# Patient Record
Sex: Female | Born: 1960 | Race: Black or African American | Hispanic: No | Marital: Married | Smoking: Never smoker
Health system: Southern US, Community
[De-identification: ages and names within clinical notes are randomized; demographics above are authoritative.]

## PROBLEM LIST (undated history)

## (undated) DIAGNOSIS — E119 Type 2 diabetes mellitus without complications: Secondary | ICD-10-CM

## (undated) DIAGNOSIS — I10 Essential (primary) hypertension: Secondary | ICD-10-CM

## (undated) HISTORY — DX: Type 2 diabetes mellitus without complications: E11.9

---

## 1995-05-02 HISTORY — PX: TUBAL LIGATION: SHX77

## 2005-05-24 ENCOUNTER — Ambulatory Visit (HOSPITAL_COMMUNITY): Admission: RE | Admit: 2005-05-24 | Discharge: 2005-05-24 | Payer: Self-pay | Admitting: *Deleted

## 2006-05-29 ENCOUNTER — Ambulatory Visit (HOSPITAL_COMMUNITY): Admission: RE | Admit: 2006-05-29 | Discharge: 2006-05-29 | Payer: Self-pay | Admitting: Obstetrics & Gynecology

## 2007-05-31 ENCOUNTER — Ambulatory Visit (HOSPITAL_COMMUNITY): Admission: RE | Admit: 2007-05-31 | Discharge: 2007-05-31 | Payer: Self-pay | Admitting: Obstetrics & Gynecology

## 2008-06-12 ENCOUNTER — Ambulatory Visit (HOSPITAL_COMMUNITY): Admission: RE | Admit: 2008-06-12 | Discharge: 2008-06-12 | Payer: Self-pay | Admitting: Obstetrics & Gynecology

## 2009-03-13 ENCOUNTER — Emergency Department (HOSPITAL_COMMUNITY): Admission: EM | Admit: 2009-03-13 | Discharge: 2009-03-13 | Payer: Self-pay | Admitting: Emergency Medicine

## 2009-09-08 ENCOUNTER — Ambulatory Visit (HOSPITAL_COMMUNITY): Admission: RE | Admit: 2009-09-08 | Discharge: 2009-09-08 | Payer: Self-pay

## 2010-08-03 LAB — URINALYSIS, ROUTINE W REFLEX MICROSCOPIC
Bilirubin Urine: NEGATIVE
Glucose, UA: NEGATIVE mg/dL
Protein, ur: 30 mg/dL — AB
Specific Gravity, Urine: 1.027 (ref 1.005–1.030)
pH: 5.5 (ref 5.0–8.0)

## 2010-08-03 LAB — POCT I-STAT, CHEM 8
Calcium, Ion: 1.4 mmol/L — ABNORMAL HIGH (ref 1.12–1.32)
Chloride: 106 mEq/L (ref 96–112)
Glucose, Bld: 123 mg/dL — ABNORMAL HIGH (ref 70–99)
HCT: 42 % (ref 36.0–46.0)
Hemoglobin: 14.3 g/dL (ref 12.0–15.0)
Sodium: 139 mEq/L (ref 135–145)
TCO2: 23 mmol/L (ref 0–100)

## 2010-08-03 LAB — URINE MICROSCOPIC-ADD ON

## 2010-09-09 ENCOUNTER — Other Ambulatory Visit (HOSPITAL_COMMUNITY): Payer: Self-pay | Admitting: Obstetrics & Gynecology

## 2010-09-09 DIAGNOSIS — Z1231 Encounter for screening mammogram for malignant neoplasm of breast: Secondary | ICD-10-CM

## 2010-09-16 ENCOUNTER — Ambulatory Visit (HOSPITAL_COMMUNITY)
Admission: RE | Admit: 2010-09-16 | Discharge: 2010-09-16 | Disposition: A | Discharge status: Home / Self Care | Payer: Self-pay | Source: Ambulatory Visit | Attending: Obstetrics & Gynecology | Admitting: Obstetrics & Gynecology

## 2010-09-16 DIAGNOSIS — Z1231 Encounter for screening mammogram for malignant neoplasm of breast: Secondary | ICD-10-CM

## 2012-04-18 ENCOUNTER — Telehealth (HOSPITAL_COMMUNITY): Payer: Self-pay | Admitting: *Deleted

## 2012-04-18 NOTE — Telephone Encounter (Signed)
Telephoned patient at home # and no answer. 

## 2012-08-16 ENCOUNTER — Telehealth (HOSPITAL_COMMUNITY): Payer: Self-pay | Admitting: *Deleted

## 2012-08-16 NOTE — Telephone Encounter (Signed)
Telephoned patient and left message to return call to [facility] 

## 2015-08-18 ENCOUNTER — Other Ambulatory Visit: Payer: Self-pay

## 2015-08-18 DIAGNOSIS — Z1231 Encounter for screening mammogram for malignant neoplasm of breast: Secondary | ICD-10-CM

## 2015-09-01 ENCOUNTER — Ambulatory Visit
Admission: RE | Admit: 2015-09-01 | Discharge: 2015-09-01 | Disposition: A | Discharge status: Home / Self Care | Payer: BLUE CROSS/BLUE SHIELD | Source: Ambulatory Visit

## 2015-09-01 DIAGNOSIS — Z1231 Encounter for screening mammogram for malignant neoplasm of breast: Secondary | ICD-10-CM

## 2017-08-16 ENCOUNTER — Other Ambulatory Visit: Payer: Self-pay

## 2017-08-16 ENCOUNTER — Encounter (HOSPITAL_COMMUNITY): Payer: Self-pay | Admitting: Emergency Medicine

## 2017-08-16 ENCOUNTER — Emergency Department (HOSPITAL_COMMUNITY)
Admission: EM | Admit: 2017-08-16 | Discharge: 2017-08-17 | Disposition: A | Discharge status: Home / Self Care | Payer: 59 | Attending: Emergency Medicine | Admitting: Emergency Medicine

## 2017-08-16 DIAGNOSIS — I10 Essential (primary) hypertension: Secondary | ICD-10-CM

## 2017-08-16 DIAGNOSIS — R51 Headache: Secondary | ICD-10-CM | POA: Insufficient documentation

## 2017-08-16 DIAGNOSIS — R739 Hyperglycemia, unspecified: Secondary | ICD-10-CM | POA: Diagnosis not present

## 2017-08-16 DIAGNOSIS — H539 Unspecified visual disturbance: Secondary | ICD-10-CM | POA: Diagnosis not present

## 2017-08-16 HISTORY — DX: Essential (primary) hypertension: I10

## 2017-08-16 LAB — I-STAT CHEM 8, ED
BUN: 7 mg/dL (ref 6–20)
CHLORIDE: 107 mmol/L (ref 101–111)
Calcium, Ion: 1.21 mmol/L (ref 1.15–1.40)
Creatinine, Ser: 0.6 mg/dL (ref 0.44–1.00)
GLUCOSE: 447 mg/dL — AB (ref 65–99)
HCT: 42 % (ref 36.0–46.0)
Hemoglobin: 14.3 g/dL (ref 12.0–15.0)
POTASSIUM: 3.7 mmol/L (ref 3.5–5.1)
Sodium: 138 mmol/L (ref 135–145)
TCO2: 21 mmol/L — ABNORMAL LOW (ref 22–32)

## 2017-08-16 NOTE — ED Provider Notes (Signed)
[facility]-EMERGENCY DEPT Provider Note   CSN: [id number] Arrival date & time: 08/16/17  2028     History   Chief Complaint Chief Complaint  Patient presents with  . Hypertension    HPI Cindy Shepherd is a 57 y.o. female.  The history is provided by the patient.  Hypertension  This is a new problem. Episode onset: Unknown. The problem occurs constantly. The problem has not changed since onset.Associated symptoms include headaches. Pertinent negatives include no chest pain, no abdominal pain and no shortness of breath. Nothing aggravates the symptoms. Nothing relieves the symptoms.   Patient with previous history of hypertension that is untreated.  She also reports history of "borderline diabetes " Reports for over a week she has been having mild blurred vision.  Denies blindness or vision loss.  No diplopia noted.  She reports mild headache.  No focal weakness.  No slurred speech. She also reports increased thirst and polyuria She is been using over-the-counter herbal medications without improvement [facility] they told her to go to the ER to have her blood pressure rapidly lowered Past Medical History:  Diagnosis Date  . Hypertension     There are no active problems to display for this patient.   History reviewed. No pertinent surgical history.   OB History   None      Home Medications    Prior to Admission medications   Not on File    Family History No family history on file.  Social History Social History   Tobacco Use  . Smoking status: Never Smoker  . Smokeless tobacco: Never Used  Substance Use Topics  . Alcohol use: Not Currently  . Drug use: Not on file     Allergies   Patient has no known allergies.   Review of Systems Review of Systems  Constitutional: Negative for fever.  Eyes: Positive for visual disturbance.  Respiratory: Negative for shortness of breath.   Cardiovascular:  Negative for chest pain.  Gastrointestinal: Negative for abdominal pain.  Neurological: Positive for headaches. Negative for speech difficulty and weakness.  All other systems reviewed and are negative.    Physical Exam Updated Vital Signs BP (!) 166/121 (BP Location: Left Arm)   Pulse 94   Temp 98.7 F (37.1 C) (Oral)   Resp 16   Ht 1.753 m (5\' 9" )   Wt (!) 137.9 kg (304 lb)   SpO2 98%   BMI 44.89 kg/m   Physical Exam CONSTITUTIONAL: Well developed/well nourished HEAD: Normocephalic/atraumatic EYES: EOMI/PERRL, no nystagmus, no visual field deficit  no ptosis ENMT: Mucous membranes moist NECK: supple no meningeal signs, no bruits CV: S1/S2 noted, no murmurs/rubs/gallops noted LUNGS: Lungs are clear to auscultation bilaterally, no apparent distress ABDOMEN: soft, nontender, no rebound or guarding GU:no cva tenderness NEURO:Awake/alert, face symmetric, no arm or leg drift is noted Equal 5/5 strength with shoulder abduction, elbow flex/extension, wrist flex/extension in upper extremities and equal hand grips bilaterally Equal 5/5 strength with hip flexion,knee flex/extension, foot dorsi/plantar flexion Cranial nerves 3/4/5/6/11/06/08/11/12 tested and intact Gait normal without ataxia No past pointing Sensation to light touch intact in all extremities EXTREMITIES: pulses normal, full ROM SKIN: warm, color normal PSYCH: no abnormalities of mood noted   ED Treatments / Results  Labs (all labs ordered are listed, but only abnormal results are displayed) Labs Reviewed  URINALYSIS, ROUTINE W REFLEX MICROSCOPIC - Abnormal; Notable for the following components:      Result  Value   Color, Urine STRAW (*)    Specific Gravity, Urine 1.036 (*)    Glucose, UA >=500 (*)    Hgb urine dipstick SMALL (*)    Ketones, ur 5 (*)    Bacteria, UA RARE (*)    Squamous Epithelial / LPF 0-5 (*)    All other components within normal limits  I-STAT CHEM 8, ED - Abnormal; Notable for the  following components:   Glucose, Bld 447 (*)    TCO2 21 (*)    All other components within normal limits    EKG None  Radiology No results found.  Procedures Procedures (including critical care time)  Medications Ordered in ED Medications - No data to display   Initial Impression / Assessment and Plan / ED Course  I have reviewed the triage vital signs and the nursing notes.  Pertinent labs  results that were available during my care of the patient were reviewed by me and considered in my medical decision making (see chart for details).     Patient found to have hyperglycemia with glucose over 400.  No signs of DKA.  She is well-appearing.  I suspect this explains her blurred vision and polyuria.  Will start her on metformin.  Will refer to PCP.  Will hold on starting blood pressure medicines for now, as I feel is more important for her to get her glucose under control. We discussed strict return precautions Final Clinical Impressions(s) / ED Diagnoses   Final diagnoses:  Essential hypertension  Hyperglycemia    ED Discharge Orders        Ordered    metFORMIN (GLUCOPHAGE) 500 MG tablet  2 times daily with meals     08/17/17 0037       Zadie RhineWickline, Tommye Lehenbauer, MD 08/17/17 0041

## 2017-08-16 NOTE — ED Triage Notes (Addendum)
Pt arriving from [facility]. Pt sent here due to high blood pressure. Pt reports BP was 176/108 at [facility]. Pt reports it has been a couple weeks since she has taken her prescribed BP medications. Pt states she had been trying herbal supplements instead. Pt having some light headedness and blurred vision.

## 2017-08-17 LAB — URINALYSIS, ROUTINE W REFLEX MICROSCOPIC
Bilirubin Urine: NEGATIVE
Glucose, UA: >=500 mg/dL — AB
KETONES UR: 5 mg/dL — AB
Leukocytes, UA: NEGATIVE
NITRITE: NEGATIVE
PH: 5 (ref 5.0–8.0)
Protein, ur: NEGATIVE mg/dL
SPECIFIC GRAVITY, URINE: 1.036 — AB (ref 1.005–1.030)

## 2017-08-17 MED ORDER — METFORMIN HCL 500 MG PO TABS: 500.0000 mg | ORAL_TABLET | Freq: Two times a day (BID) | ORAL | 0 refills | Status: DC

## 2017-08-27 DIAGNOSIS — E119 Type 2 diabetes mellitus without complications: Secondary | ICD-10-CM | POA: Insufficient documentation

## 2017-09-13 ENCOUNTER — Emergency Department (HOSPITAL_COMMUNITY)
Admission: EM | Admit: 2017-09-13 | Discharge: 2017-09-13 | Disposition: A | Discharge status: Home / Self Care | Payer: 59 | Attending: Emergency Medicine | Admitting: Emergency Medicine

## 2017-09-13 ENCOUNTER — Encounter (HOSPITAL_COMMUNITY): Payer: Self-pay | Admitting: *Deleted

## 2017-09-13 DIAGNOSIS — Z79899 Other long term (current) drug therapy: Secondary | ICD-10-CM | POA: Diagnosis not present

## 2017-09-13 DIAGNOSIS — N200 Calculus of kidney: Secondary | ICD-10-CM | POA: Diagnosis not present

## 2017-09-13 DIAGNOSIS — M5489 Other dorsalgia: Secondary | ICD-10-CM | POA: Insufficient documentation

## 2017-09-13 DIAGNOSIS — I10 Essential (primary) hypertension: Secondary | ICD-10-CM | POA: Insufficient documentation

## 2017-09-13 DIAGNOSIS — R319 Hematuria, unspecified: Secondary | ICD-10-CM | POA: Diagnosis present

## 2017-09-13 LAB — COMPREHENSIVE METABOLIC PANEL
ALK PHOS: 81 U/L (ref 38–126)
ALT: 33 U/L (ref 14–54)
AST: 26 U/L (ref 15–41)
Albumin: 3.9 g/dL (ref 3.5–5.0)
Anion gap: 12 (ref 5–15)
BUN: 11 mg/dL (ref 6–20)
CO2: 19 mmol/L — ABNORMAL LOW (ref 22–32)
CREATININE: 0.76 mg/dL (ref 0.44–1.00)
Calcium: 10.6 mg/dL — ABNORMAL HIGH (ref 8.9–10.3)
Chloride: 108 mmol/L (ref 101–111)
GFR calc Af Amer: >60 mL/min (ref >60)
Glucose, Bld: 197 mg/dL — ABNORMAL HIGH (ref 65–99)
Potassium: 4.1 mmol/L (ref 3.5–5.1)
Sodium: 139 mmol/L (ref 135–145)
Total Bilirubin: 0.9 mg/dL (ref 0.3–1.2)
Total Protein: 8.2 g/dL — ABNORMAL HIGH (ref 6.5–8.1)

## 2017-09-13 LAB — CBC WITH DIFFERENTIAL/PLATELET
BASOS ABS: 0 10*3/uL (ref 0.0–0.1)
Basophils Relative: 0 %
Eosinophils Absolute: 0 10*3/uL (ref 0.0–0.7)
Eosinophils Relative: 0 %
HCT: 38.7 % (ref 36.0–46.0)
Hemoglobin: 12.8 g/dL (ref 12.0–15.0)
LYMPHS ABS: 0.9 10*3/uL (ref 0.7–4.0)
Lymphocytes Relative: 7 %
MCH: 27.6 pg (ref 26.0–34.0)
MCHC: 33.1 g/dL (ref 30.0–36.0)
MCV: 83.6 fL (ref 78.0–100.0)
MONO ABS: 0.3 10*3/uL (ref 0.1–1.0)
Monocytes Relative: 2 %
Neutro Abs: 11.9 10*3/uL — ABNORMAL HIGH (ref 1.7–7.7)
Neutrophils Relative %: 91 %
PLATELETS: 290 10*3/uL (ref 150–400)
RBC: 4.63 MIL/uL (ref 3.87–5.11)
RDW: 14.1 % (ref 11.5–15.5)
WBC: 13.1 10*3/uL — AB (ref 4.0–10.5)

## 2017-09-13 LAB — URINALYSIS, ROUTINE W REFLEX MICROSCOPIC
Bilirubin Urine: NEGATIVE
Ketones, ur: 80 mg/dL — AB
Leukocytes, UA: NEGATIVE
Nitrite: NEGATIVE
Protein, ur: NEGATIVE mg/dL
SPECIFIC GRAVITY, URINE: 1.014 (ref 1.005–1.030)
pH: 6 (ref 5.0–8.0)

## 2017-09-13 LAB — LIPASE, BLOOD: Lipase: 26 U/L (ref 11–51)

## 2017-09-13 MED ORDER — ONDANSETRON HCL 4 MG/2ML IJ SOLN
4.0000 mg | Freq: Once | INTRAMUSCULAR | Status: AC
  Administered 2017-09-13: 4 mg via INTRAVENOUS
  Filled 2017-09-13: qty 2

## 2017-09-13 MED ORDER — KETOROLAC TROMETHAMINE 30 MG/ML IJ SOLN
30.0000 mg | Freq: Once | INTRAMUSCULAR | Status: AC
  Administered 2017-09-13: 30 mg via INTRAVENOUS
  Filled 2017-09-13: qty 1

## 2017-09-13 MED ORDER — ONDANSETRON 4 MG PO TBDP: 4.0000 mg | ORAL_TABLET | Freq: Once | ORAL | Status: DC

## 2017-09-13 MED ORDER — ONDANSETRON 4 MG PO TBDP: 4.0000 mg | ORAL_TABLET | Freq: Three times a day (TID) | ORAL | 0 refills | Status: DC | PRN

## 2017-09-13 MED ORDER — HYDROMORPHONE HCL 1 MG/ML IJ SOLN
1.0000 mg | Freq: Once | INTRAMUSCULAR | Status: AC
  Administered 2017-09-13: 1 mg via INTRAVENOUS
  Filled 2017-09-13: qty 1

## 2017-09-13 MED ORDER — SODIUM CHLORIDE 0.9 % IV BOLUS
1000.0000 mL | Freq: Once | INTRAVENOUS | Status: AC
  Administered 2017-09-13: 1000 mL via INTRAVENOUS

## 2017-09-13 MED ORDER — TAMSULOSIN HCL 0.4 MG PO CAPS: 0.4000 mg | ORAL_CAPSULE | Freq: Every day | ORAL | 0 refills | Status: DC

## 2017-09-13 MED ORDER — OXYCODONE-ACETAMINOPHEN 5-325 MG PO TABS
1.0000 | ORAL_TABLET | Freq: Once | ORAL | Status: AC
  Administered 2017-09-13: 1 via ORAL
  Filled 2017-09-13: qty 1

## 2017-09-13 MED ORDER — OXYCODONE-ACETAMINOPHEN 5-325 MG PO TABS: 1.0000 | ORAL_TABLET | Freq: Four times a day (QID) | ORAL | 0 refills | Status: DC | PRN

## 2017-09-13 NOTE — Discharge Instructions (Signed)
Please read instructions below. °Drink plenty of water. °You can take Percocet every 6 hours as needed for pain. °You can take Zofran every 6 hours as needed for nausea. °Take flomax once per day for bladder spasm. °Follow up with Urology if you have not passed the stone in the next few days. °Return to the ER for fever, chills, uncontrollable vomiting, or worsening symptoms. ° °

## 2017-09-13 NOTE — ED Provider Notes (Signed)
[facility]-EMERGENCY DEPT Provider Note   CSN: [id number] Arrival date & time: 09/13/17  1153     History   Chief Complaint Chief Complaint  Patient presents with  . Hematuria  . Back Pain    HPI Cindy Shepherd is a 57 y.o. female with past medical history of hypertension, nephrolithiasis, presenting to the ED with acute onset of right-sided flank pain that began this morning.  Patient states pain is constant and sharp.  States it feels very similar to history of kidney stone.  Reports associated nausea, vomiting, and hematuria.  Denies fever, chills, significant abdominal pain, vaginal bleeding or discharge, or other complaints.  No medications tried prior to arrival.  The history is provided by the patient.    Past Medical History:  Diagnosis Date  . Hypertension     There are no active problems to display for this patient.   History reviewed. No pertinent surgical history.   OB History   None      Home Medications    Prior to Admission medications   Medication Sig Start Date End Date Taking? Authorizing Provider  amLODipine (NORVASC) 10 MG tablet Take 10 mg by mouth daily. 08/17/17  Yes [provider]  JANUMET 50-500 MG tablet Take 1 tablet by mouth 2 (two) times daily. 08/28/17  Yes [provider]  metFORMIN (GLUCOPHAGE) 500 MG tablet Take 1 tablet (500 mg total) by mouth 2 (two) times daily with a meal. Patient not taking: Reported on 09/13/2017 08/17/17   Zadie Rhine, MD  ondansetron (ZOFRAN ODT) 4 MG disintegrating tablet Take 1 tablet (4 mg total) by mouth every 8 (eight) hours as needed for nausea or vomiting. 09/13/17   Michaelyn Wall, [place] N, PA-C  oxyCODONE-acetaminophen (PERCOCET/ROXICET) 5-325 MG tablet Take 1-2 tablets by mouth every 6 (six) hours as needed for severe pain. 09/13/17   Jalyne Brodzinski, [place] N, PA-C  tamsulosin (FLOMAX) 0.4 MG CAPS capsule Take 1 capsule (0.4 mg total) by mouth daily. 09/13/17   Shalae Belmonte,  [place] N, PA-C    Family History No family history on file.  Social History Social History   Tobacco Use  . Smoking status: Never Smoker  . Smokeless tobacco: Never Used  Substance Use Topics  . Alcohol use: Not Currently  . Drug use: Not on file     Allergies   Penicillins   Review of Systems Review of Systems  Constitutional: Negative for chills and fever.  Gastrointestinal: Positive for nausea and vomiting. Negative for abdominal pain.  Genitourinary: Positive for flank pain, frequency and hematuria. Negative for dysuria, vaginal bleeding and vaginal discharge.  All other systems reviewed and are negative.    Physical Exam Updated Vital Signs BP (!) 138/58   Pulse 93   Temp 98.8 F (37.1 C) (Oral)   Resp 17   Ht  (1.753 m)   Wt 117 kg (258 lb)   SpO2 94%   BMI 38.10 kg/m   Physical Exam  Constitutional: She appears well-developed and well-nourished.  Patient appears significantly uncomfortable on initial evaluation.  HENT:  Head: Normocephalic and atraumatic.  Mouth/Throat: Oropharynx is clear and moist.  Eyes: Conjunctivae are normal.  Cardiovascular: Normal rate, regular rhythm and intact distal pulses.  Pulmonary/Chest: Effort normal and breath sounds normal. No respiratory distress.  Abdominal: Soft. Bowel sounds are normal. She exhibits no mass. There is no tenderness. There is CVA tenderness (Right). There is no rebound and no guarding.  Neurological: She is alert.  Skin:  Skin is warm.  Psychiatric: She has a normal mood and affect. Her behavior is normal.  Nursing note and vitals reviewed.    ED Treatments / Results  Labs (all labs ordered are listed, but only abnormal results are displayed) Labs Reviewed  URINALYSIS, ROUTINE W REFLEX MICROSCOPIC - Abnormal; Notable for the following components:      Result Value   APPearance HAZY (*)    Glucose, UA >=500 (*)    Hgb urine dipstick LARGE (*)    Ketones, ur 80 (*)    RBC / HPF >50  (*)    Bacteria, UA FEW (*)    All other components within normal limits  COMPREHENSIVE METABOLIC PANEL - Abnormal; Notable for the following components:   CO2 19 (*)    Glucose, Bld 197 (*)    Calcium 10.6 (*)    Total Protein 8.2 (*)    All other components within normal limits  CBC WITH DIFFERENTIAL/PLATELET - Abnormal; Notable for the following components:   WBC 13.1 (*)    Neutro Abs 11.9 (*)    All other components within normal limits  LIPASE, BLOOD    EKG None  Radiology No results found.  Procedures Procedures (including critical care time)  Medications Ordered in ED Medications  sodium chloride 0.9 % bolus 1,000 mL (0 mLs Intravenous Stopped 09/13/17 1901)  ondansetron (ZOFRAN) injection 4 mg (4 mg Intravenous Given 09/13/17 1754)  HYDROmorphone (DILAUDID) injection 1 mg (1 mg Intravenous Given 09/13/17 1754)  ketorolac (TORADOL) 30 MG/ML injection 30 mg (30 mg Intravenous Given 09/13/17 1932)  oxyCODONE-acetaminophen (PERCOCET/ROXICET) 5-325 MG per tablet 1 tablet (1 tablet Oral Given 09/13/17 1932)     Initial Impression / Assessment and Plan / ED Course  I have reviewed the triage vital signs and the nursing notes.  Pertinent labs & imaging results that were available during my care of the patient were reviewed by me and considered in my medical decision making (see chart for details).     Pt presenting with  right flank pain with assoc nausea, vomiting, hematuria.  History of nephrolithiasis, patient states it feels the same.  Abdomen is soft and nontender.  UA with large hemoglobin, and no signs of infection; collection does appear to be contaminated.  Serum creatine WNL, vitals sign stable.  Symptoms treated in the ED with pain medication, fluids, and Zofran.  On reevaluation, patient reporting her pain is 0/10 severity and nausea has resolved.  Patient tolerating p.o. liquids.  Pt will be discharged home with pain medications & has been advised to follow up with  PCP/Urologist.  Discussed results, findings, treatment and follow up. Patient advised of return precautions. Patient verbalized understanding and agreed with plan.  Final Clinical Impressions(s) / ED Diagnoses   Final diagnoses:  Nephrolithiasis    ED Discharge Orders        Ordered    oxyCODONE-acetaminophen (PERCOCET/ROXICET) 5-325 MG tablet  Every 6 hours PRN     09/13/17 1939    ondansetron (ZOFRAN ODT) 4 MG disintegrating tablet  Every 8 hours PRN     09/13/17 1939    tamsulosin (FLOMAX) 0.4 MG CAPS capsule  Daily     09/13/17 1939       Zehra Rucci, [place] N, PA-C 09/13/17 2012    Melene Plan, DO 09/13/17 2242

## 2017-09-13 NOTE — ED Triage Notes (Signed)
Pt complains of back pain, hematuria and nausea/emesis since this morning. Pt has hx of kidney stones.

## 2017-09-13 NOTE — ED Notes (Signed)
Pt provided with water for PO challenge.

## 2017-09-15 DIAGNOSIS — N200 Calculus of kidney: Secondary | ICD-10-CM | POA: Insufficient documentation

## 2017-10-04 ENCOUNTER — Ambulatory Visit: Payer: 59

## 2017-10-09 ENCOUNTER — Encounter: Payer: Self-pay | Admitting: Dietician

## 2017-10-09 ENCOUNTER — Encounter: Payer: 59 | Attending: *Deleted | Admitting: Dietician

## 2017-10-09 DIAGNOSIS — E119 Type 2 diabetes mellitus without complications: Secondary | ICD-10-CM | POA: Diagnosis not present

## 2017-10-09 DIAGNOSIS — Z6841 Body Mass Index (BMI) 40.0 and over, adult: Secondary | ICD-10-CM | POA: Diagnosis not present

## 2017-10-09 DIAGNOSIS — Z713 Dietary counseling and surveillance: Secondary | ICD-10-CM | POA: Diagnosis present

## 2017-10-09 NOTE — Progress Notes (Signed)
Patient was seen on 10/09/17 for the first of a series of three diabetes self-management courses at the [facility].  Patient Education Plan per assessed needs and concerns is to attend three course education program for Diabetes Self Management Education.  The following learning objectives were met by the patient during this class:  Describe diabetes  State some common risk factors for diabetes  Defines the role of glucose and insulin  Identifies type of diabetes and pathophysiology  Describe the relationship between diabetes and cardiovascular risk  State the members of the Healthcare Team  States the rationale for glucose monitoring  State when to test glucose  State their individual Target Range  State the importance of logging glucose readings  Describe how to interpret glucose readings  Identifies A1C target  Explain the correlation between A1c and eAG values  State symptoms and treatment of high blood glucose  State symptoms and treatment of low blood glucose  Explain proper technique for glucose testing  Identifies proper sharps disposal  Handouts given during class include:  ADA Diabetes You Take Control   Carb Counting and Meal Planning book  Meal Plan Card  Meal planning worksheet  Low Sodium Flavoring Tips  Types of Fats  The diabetes portion plate  A1c to eAG Conversion Chart  Diabetes Recommended Care Schedule  Support Group  Diabetes Success Plan  Core Class Satisfaction Survey   Follow-Up Plan:  Attend core 2   

## 2017-10-11 ENCOUNTER — Ambulatory Visit: Payer: 59

## 2017-10-16 ENCOUNTER — Encounter: Payer: 59 | Admitting: Dietician

## 2017-10-16 DIAGNOSIS — E119 Type 2 diabetes mellitus without complications: Secondary | ICD-10-CM

## 2017-10-16 DIAGNOSIS — Z713 Dietary counseling and surveillance: Secondary | ICD-10-CM | POA: Diagnosis not present

## 2017-10-16 NOTE — Progress Notes (Signed)
Patient was seen on 10/16/17 for the second of a series of three diabetes self-management courses at the [facility]. The following learning objectives were met by the patient during this class:   Describe the role of different macronutrients on glucose  Explain how carbohydrates affect blood glucose  State what foods contain the most carbohydrates  Demonstrate carbohydrate counting  Demonstrate how to read Nutrition Facts food label  Describe effects of various fats on heart health  Describe the importance of good nutrition for health and healthy eating strategies  Describe techniques for managing your shopping, cooking and meal planning  List strategies to follow meal plan when dining out  Describe the effects of alcohol on glucose and how to use it safely  Goals:  Follow Diabetes Meal Plan as instructed  Aim to spread carbs evenly throughout the day  Aim for 3 meals per day and snacks as needed Include lean protein foods to meals/snacks  Monitor glucose levels as instructed by your doctor   Follow-Up Plan:  Attend Core 3  Work towards following your personal food plan.

## 2017-10-18 ENCOUNTER — Ambulatory Visit: Payer: 59

## 2017-10-23 ENCOUNTER — Encounter: Payer: Self-pay | Admitting: Dietician

## 2017-10-23 ENCOUNTER — Encounter: Payer: 59 | Admitting: Dietician

## 2017-10-23 DIAGNOSIS — E119 Type 2 diabetes mellitus without complications: Secondary | ICD-10-CM

## 2017-10-23 DIAGNOSIS — Z713 Dietary counseling and surveillance: Secondary | ICD-10-CM | POA: Diagnosis not present

## 2017-10-23 NOTE — Progress Notes (Signed)
Patient was seen on 10/23/17 for the third of a series of three diabetes self-management courses at the [facility].   Cindy Shepherd. State the amount of activity recommended for healthy living . Describe activities suitable for individual needs . Identify ways to regularly incorporate activity into daily life . Identify barriers to activity and ways to over come these barriers  Identify diabetes medications being personally used and their primary action for lowering glucose and possible side effects . Describe role of stress on blood glucose and develop strategies to address psychosocial issues . Identify diabetes complications and ways to prevent them  Explain how to manage diabetes during illness . Evaluate success in meeting personal goal . Establish 2-3 goals that they will plan to diligently work on  Goals:   I will count my carb choices at most meals and snacks  I will be active by starting to exercise more.  Your patient has identified these potential barriers to change:  Finances  Your patient has identified their diabetes self-care support plan as  On-line resources  American Diabetes Association Website    Plan:  Attend Support Group as desired

## 2018-01-28 ENCOUNTER — Encounter: Payer: 59 | Attending: *Deleted | Admitting: Dietician

## 2018-01-28 ENCOUNTER — Encounter: Payer: Self-pay | Admitting: Dietician

## 2018-01-28 DIAGNOSIS — E119 Type 2 diabetes mellitus without complications: Secondary | ICD-10-CM | POA: Insufficient documentation

## 2018-01-28 DIAGNOSIS — Z713 Dietary counseling and surveillance: Secondary | ICD-10-CM | POA: Insufficient documentation

## 2018-01-28 NOTE — Progress Notes (Signed)
Diabetes Self-Management Education  Visit Type:  Follow-up  Appt. Start Time: 0845 Appt. End Time: 0915  01/28/2018  [name], identified by name and date of birth, is a 57 y.o. female with a diagnosis of Diabetes: Type 2.   Other history includes HTN, HLD. Medications include Janumet Patient is here today alone.  She has continued to lose weight and uses the Lose it app on her phone to track her intake.  Goal is 1750 calories per day. She states that she is very hungry.  She would like to know what foods to eat to fill her up and still be able to lose weight.  She reports that this started a couple of weeks ago.  She noted some depression and increased stress during this time. Her diet has few fruits and vegetables and many snack foods without significant nutritional value. Discussed tips to improve this within a budget.  Discussed the many ways beans can be used and that these can replace meat.  Weight hx: 08/16/17 304 lbs 10/09/17:  284 lbs 01/28/18:  270 lbs. Patient has lost 34 lbs in the past 5 months.  Patient lives with her husband and son.  She does the shopping and cooking.  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  She travels in 3 counties and works from home at times.  Her stress has increased with her job as well due to a loss of an employee.  She is also helping her son and his family (pregnant wife and 2 children) with his bills as he is underemployed.  This puts additional stress on her.  ASSESSMENT  Weight 270 lb (122.5 kg). Body mass index is 39.02 kg/m.   Diabetes Self-Management Education - 01/28/18 1017      Psychosocial Assessment   Patient Belief/Attitude about Diabetes  Motivated to manage diabetes    Self-care barriers  None    Self-management support  Doctor's office;Family    Patient Concerns  Nutrition/Meal planning    Special Needs  None    Preferred Learning Style  No preference indicated     Learning Readiness  Ready      Pre-Education Assessment   Patient understands the diabetes disease and treatment process.  Demonstrates understanding / competency    Patient understands incorporating nutritional management into lifestyle.  Needs Review    Patient undertands incorporating physical activity into lifestyle.  Needs Review    Patient understands using medications safely.  Demonstrates understanding / competency    Patient understands monitoring blood glucose, interpreting and using results  Demonstrates understanding / competency    Patient understands prevention, detection, and treatment of acute complications.  Demonstrates understanding / competency    Patient understands prevention, detection, and treatment of chronic complications.  Demonstrates understanding / competency    Patient understands how to develop strategies to address psychosocial issues.  Needs Review    Patient understands how to develop strategies to promote health/change behavior.  Needs Review      Complications   How often do you check your blood sugar?  Not recommended by provider      Dietary Intake   Breakfast  chocolate chip cookies, mocha drink    Lunch  2 sausage, 2 scrambled eggs    Snack (afternoon)  skinny popcorn, 2 SF chocolate candy    Dinner  homemade taco dip, tortilla chips    Snack (evening)  homemade PB crackers    Beverage(s)  flavored water,  coffee, regular lemonade, rare diet soda      Exercise   Exercise Type  ADL's      Patient Education   Previous Diabetes Education  Yes (please comment)   core DM classes June 2019   Nutrition management   Other (comment);Meal options for control of blood glucose level and chronic complications.   healthier snack options   Physical activity and exercise   Helped patient identify appropriate exercises in relation to his/her diabetes, diabetes complications and other health issue.    Medications  Reviewed patients medication for diabetes,  action, purpose, timing of dose and side effects.    Psychosocial adjustment  Worked with patient to identify barriers to care and solutions;Role of stress on diabetes    Personal strategies to promote health  Lifestyle issues that need to be addressed for better diabetes care      Individualized Goals (developed by patient)   Nutrition  General guidelines for healthy choices and portions discussed    Physical Activity  Exercise 3-5 times per week;15 minutes per day    Medications  take my medication as prescribed    Monitoring   Not Applicable    Reducing Risk  increase portions of healthy fats    Health Coping  discuss diabetes with (comment)   MD, RD, CDE     Patient Self-Evaluation of Goals - Patient rates self as meeting previously set goals (% of time)   Nutrition  50 - 75 %    Physical Activity  < 25%    Medications  >75%    Monitoring  Not Applicable    Problem Solving  50 - 75 %    Reducing Risk  50 - 75 %    Health Coping  >75%      Post-Education Assessment   Patient understands the diabetes disease and treatment process.  Demonstrates understanding / competency    Patient understands incorporating nutritional management into lifestyle.  Demonstrates understanding / competency    Patient undertands incorporating physical activity into lifestyle.  Demonstrates understanding / competency    Patient understands using medications safely.  Demonstrates understanding / competency    Patient understands monitoring blood glucose, interpreting and using results  Demonstrates understanding / competency    Patient understands prevention, detection, and treatment of acute complications.  Demonstrates understanding / competency    Patient understands prevention, detection, and treatment of chronic complications.  Demonstrates understanding / competency    Patient understands how to develop strategies to address psychosocial issues.  Demonstrates understanding / competency    Patient  understands how to develop strategies to promote health/change behavior.  Demonstrates understanding / competency      Outcomes   Program Status  Completed      Subsequent Visit   Since your last visit have you continued or begun to take your medications as prescribed?  Yes    Since your last visit have you experienced any weight changes?  Loss    Weight Loss (lbs)  14    Since your last visit, are you checking your blood glucose at least once a day?  No       Learning Objective:  Patient will have a greater understanding of diabetes self-management. Patient education plan is to attend individual and/or group sessions per assessed needs and concerns.   Plan:   Patient Instructions  Continue your mindfulness!  Great job! Exercise.  Small amounts add up.  Raw vegetables (fat free Greek yogurt or fat free  sour cream mixed with ranch mix OR hummus) Fruit (nonfat vanilla Greek yogurt, cinnamon OR PB powder mixed with water) Roasting vegetables Onions and peppers with lean   sausage and eggs Water with lemon, lime, or orange slices    Expected Outcomes:  Demonstrated interest in learning. Expect positive outcomes  Education material provided: Meal plan card and Snack sheet, food resource sheets including free meals for her son.  If problems or questions, patient to contact team via:  Phone  Future DSME appointment: - PRN

## 2018-01-28 NOTE — Patient Instructions (Addendum)
Continue your mindfulness!  Great job! Exercise.  Small amounts add up.  Raw vegetables (fat free Greek yogurt or fat free sour cream mixed with ranch mix OR hummus) Fruit (nonfat vanilla Greek yogurt, cinnamon OR PB powder mixed with water) Roasting vegetables Onions and peppers with lean   sausage and eggs Water with lemon, lime, or orange slices

## 2018-05-10 LAB — HM HEPATITIS C SCREENING LAB: HM Hepatitis Screen: NEGATIVE

## 2018-06-12 ENCOUNTER — Other Ambulatory Visit: Payer: Self-pay | Admitting: *Deleted

## 2018-06-12 DIAGNOSIS — Z1231 Encounter for screening mammogram for malignant neoplasm of breast: Secondary | ICD-10-CM

## 2018-07-03 ENCOUNTER — Encounter: Payer: Self-pay | Admitting: Obstetrics and Gynecology

## 2018-07-03 ENCOUNTER — Ambulatory Visit (INDEPENDENT_AMBULATORY_CARE_PROVIDER_SITE_OTHER): Payer: 59 | Admitting: Obstetrics and Gynecology

## 2018-07-03 ENCOUNTER — Other Ambulatory Visit (HOSPITAL_COMMUNITY)
Admission: RE | Admit: 2018-07-03 | Discharge: 2018-07-03 | Disposition: A | Discharge status: Home / Self Care | Payer: 59 | Source: Ambulatory Visit | Attending: Obstetrics and Gynecology | Admitting: Obstetrics and Gynecology

## 2018-07-03 VITALS — BP 125/84 | HR 88 | Ht 69.75 in | Wt 270.3 lb

## 2018-07-03 DIAGNOSIS — N898 Other specified noninflammatory disorders of vagina: Secondary | ICD-10-CM | POA: Diagnosis not present

## 2018-07-03 DIAGNOSIS — Z124 Encounter for screening for malignant neoplasm of cervix: Secondary | ICD-10-CM | POA: Insufficient documentation

## 2018-07-03 DIAGNOSIS — E669 Obesity, unspecified: Secondary | ICD-10-CM

## 2018-07-03 DIAGNOSIS — Z01419 Encounter for gynecological examination (general) (routine) without abnormal findings: Secondary | ICD-10-CM

## 2018-07-03 NOTE — Progress Notes (Signed)
Patient comes in today as a new patient. She is her for an annual exam. She is due for Pap today. Patient is having some vaginal odor. She is wanting to discuss taking a probiotic to help with vaginal health.

## 2018-07-03 NOTE — Progress Notes (Signed)
HPI:      [name] is a 58 y.o. No obstetric history on file. who LMP was No LMP recorded. Patient is postmenopausal.  Subjective:   She presents today for her annual examination.  She complains of occasional vaginal odor.  She reports that she used Monistat last week. She is in menopause with occasional hot flashes. She has a mammogram scheduled for next week. General medications and lab work scheduled through her family physician.  She states her last hemoglobin A1c was 6.    Hx: The following portions of the patient's history were reviewed and updated as appropriate:             She  has a past medical history of Diabetes mellitus without complication (HCC) and Hypertension. She does not have a problem list on file. She  has a past surgical history that includes Tubal ligation (1997). Her family history is not on file. She  reports that she has never smoked. She has never used smokeless tobacco. She reports previous alcohol use. No history on file for drug. She has a current medication list which includes the following prescription(s): amlodipine and janumet. She is allergic to penicillins.       Review of Systems:  Review of Systems  Constitutional: Denied constitutional symptoms, night sweats, recent illness, fatigue, fever, insomnia and weight loss.  Eyes: Denied eye symptoms, eye pain, photophobia, vision change and visual disturbance.  Ears/Nose/Throat/Neck: Denied ear, nose, throat or neck symptoms, hearing loss, nasal discharge, sinus congestion and sore throat.  Cardiovascular: Denied cardiovascular symptoms, arrhythmia, chest pain/pressure, edema, exercise intolerance, orthopnea and palpitations.  Respiratory: Denied pulmonary symptoms, asthma, pleuritic pain, productive sputum, cough, dyspnea and wheezing.  Gastrointestinal: Denied, gastro-esophageal reflux, melena, nausea and vomiting.  Genitourinary: See HPI for additional information.  Musculoskeletal: Denied  musculoskeletal symptoms, stiffness, swelling, muscle weakness and myalgia.  Dermatologic: Denied dermatology symptoms, rash and scar.  Neurologic: Denied neurology symptoms, dizziness, headache, neck pain and syncope.  Psychiatric: Denied psychiatric symptoms, anxiety and depression.  Endocrine: Denied endocrine symptoms including hot flashes and night sweats.   Meds:   Current Outpatient Medications on File Prior to Visit  Medication Sig Dispense Refill  . amLODipine (NORVASC) 10 MG tablet Take 10 mg by mouth daily.  0  . JANUMET 50-500 MG tablet Take 1 tablet by mouth 2 (two) times daily.  2   No current facility-administered medications on file prior to visit.     Objective:     Vitals:   07/03/18 1023  BP: 125/84  Pulse: 88              Physical examination General NAD, Conversant  HEENT Atraumatic; Op clear with mmm.  Normo-cephalic. Pupils reactive. Anicteric sclerae  Thyroid/Neck Smooth without nodularity or enlargement. Normal ROM.  Neck Supple.  Skin No rashes, lesions or ulceration. Normal palpated skin turgor. No nodularity.  Breasts: No masses or discharge.  Symmetric.  No axillary adenopathy.  Lungs: Clear to auscultation.No rales or wheezes. Normal Respiratory effort, no retractions.  Heart: NSR.  No murmurs or rubs appreciated. No periferal edema  Abdomen: Soft.  Non-tender.  No masses.  No HSM. No hernia  Extremities: Moves all appropriately.  Normal ROM for age. No lymphadenopathy.  Neuro: Oriented to PPT.  Normal mood. Normal affect.     Pelvic:   Vulva: Normal appearance.  No lesions.  Vagina: No lesions or abnormalities noted.  Atrophy noted  Support: Normal pelvic support.  Urethra No masses  tenderness or scarring.  Meatus Normal size without lesions or prolapse.  Cervix: Normal appearance.  No lesions.  Anus: Normal exam.  No lesions.  Perineum: Normal exam.  No lesions.        Bimanual   Uterus: Normal size.  Non-tender.  Mobile.  AV.  Adnexae:  No masses.  Non-tender to palpation.  Cul-de-sac: Negative for abnormality.   Physical examination limited by patient body habitus  WET PREP: clue cells: absent, KOH (yeast): negative, odor: absent and trichomoniasis: negative Ph:  < 4.5    Assessment:    No obstetric history on file. There are no active problems to display for this patient.    1. Well woman exam with routine gynecological exam   2. Screening for cervical cancer   3. Vaginal odor   4. Obesity (BMI 35.0-39.9 without comorbidity)     No findings consistent with vaginitis.  We have discussed the use of probiotics.   Plan:            1.  Basic Screening Recommendations The basic screening recommendations for asymptomatic women were discussed with the patient during her visit.  The age-appropriate recommendations were discussed with her and the rational for the tests reviewed.  When I am informed by the patient that another primary care physician has previously obtained the age-appropriate tests and they are up-to-date, only outstanding tests are ordered and referrals given as necessary.  Abnormal results of tests will be discussed with her when all of her results are completed. Pap performed-co-test Mammogram scheduled for next week 2.  Have advised patient if she has vaginal discharge/odor symptoms she should present for repeat wet prep.   Orders No orders of the defined types were placed in this encounter.   No orders of the defined types were placed in this encounter.       F/U  Return in about 1 year (around 07/03/2019) for Annual Physical, Pt to contact us if symptoms worsen.  Elonda Husky, M.D. 07/03/2018 11:09 AM

## 2018-07-09 LAB — CYTOLOGY - PAP
Diagnosis: UNDETERMINED — AB
HPV (WINDOPATH): NOT DETECTED

## 2018-07-10 ENCOUNTER — Ambulatory Visit
Admission: RE | Admit: 2018-07-10 | Discharge: 2018-07-10 | Disposition: A | Discharge status: Home / Self Care | Payer: 59 | Source: Ambulatory Visit | Attending: *Deleted | Admitting: *Deleted

## 2018-07-10 ENCOUNTER — Other Ambulatory Visit: Payer: Self-pay

## 2018-07-10 DIAGNOSIS — Z1231 Encounter for screening mammogram for malignant neoplasm of breast: Secondary | ICD-10-CM

## 2018-07-10 LAB — HM MAMMOGRAPHY

## 2018-08-16 ENCOUNTER — Telehealth: Payer: Self-pay | Admitting: Obstetrics and Gynecology

## 2018-08-16 MED ORDER — FLUCONAZOLE 150 MG PO TABS: 150.0000 mg | ORAL_TABLET | Freq: Once | ORAL | 0 refills | Status: AC

## 2018-08-16 NOTE — Addendum Note (Signed)
Addended by: Silvano Bilis on: 08/16/2018 04:26 PM   Modules accepted: Orders

## 2018-08-16 NOTE — Telephone Encounter (Signed)
Spoke with pt and informed her that she had a yeast infection and medication was sent to her pharmacy for the yeast infection. Pt was informed that a message would be sent to [facility] to give information on her pap test results and she would receive a call on Monday.

## 2018-08-16 NOTE — Telephone Encounter (Signed)
The patient called and stated that she would like to speak with a nurse in regards to her results that were recently released in MyChart. Please advise.

## 2018-08-20 NOTE — Telephone Encounter (Signed)
Pt called and went over pap smear results.

## 2019-07-04 ENCOUNTER — Encounter: Payer: 59 | Admitting: Obstetrics and Gynecology

## 2019-07-08 ENCOUNTER — Encounter: Payer: 59 | Admitting: Obstetrics and Gynecology

## 2019-07-24 ENCOUNTER — Ambulatory Visit: Payer: 59

## 2019-07-28 ENCOUNTER — Ambulatory Visit: Payer: 59

## 2019-08-04 ENCOUNTER — Other Ambulatory Visit: Payer: Self-pay | Admitting: Obstetrics and Gynecology

## 2019-08-04 ENCOUNTER — Other Ambulatory Visit: Payer: Self-pay | Admitting: *Deleted

## 2019-08-04 ENCOUNTER — Ambulatory Visit
Admission: RE | Admit: 2019-08-04 | Discharge: 2019-08-04 | Disposition: A | Discharge status: Home / Self Care | Payer: 59 | Source: Ambulatory Visit

## 2019-08-04 DIAGNOSIS — Z1231 Encounter for screening mammogram for malignant neoplasm of breast: Secondary | ICD-10-CM

## 2019-09-23 ENCOUNTER — Encounter: Payer: Self-pay | Admitting: Obstetrics and Gynecology

## 2019-09-23 ENCOUNTER — Ambulatory Visit (INDEPENDENT_AMBULATORY_CARE_PROVIDER_SITE_OTHER): Payer: 59 | Admitting: Obstetrics and Gynecology

## 2019-09-23 ENCOUNTER — Other Ambulatory Visit: Payer: Self-pay

## 2019-09-23 VITALS — BP 133/84 | HR 93 | Ht 69.5 in | Wt 285.2 lb

## 2019-09-23 DIAGNOSIS — Z01419 Encounter for gynecological examination (general) (routine) without abnormal findings: Secondary | ICD-10-CM | POA: Diagnosis not present

## 2019-09-23 DIAGNOSIS — Z1231 Encounter for screening mammogram for malignant neoplasm of breast: Secondary | ICD-10-CM

## 2019-09-23 DIAGNOSIS — I10 Essential (primary) hypertension: Secondary | ICD-10-CM

## 2019-09-23 DIAGNOSIS — E119 Type 2 diabetes mellitus without complications: Secondary | ICD-10-CM

## 2019-09-23 DIAGNOSIS — Z1211 Encounter for screening for malignant neoplasm of colon: Secondary | ICD-10-CM

## 2019-09-23 DIAGNOSIS — Z7689 Persons encountering health services in other specified circumstances: Secondary | ICD-10-CM

## 2019-09-23 DIAGNOSIS — R61 Generalized hyperhidrosis: Secondary | ICD-10-CM

## 2019-09-23 NOTE — Progress Notes (Signed)
Pt present for annual exam. Pt stated having vaginal pain on the left side of the vaginal area. No other issues.

## 2019-09-23 NOTE — Patient Instructions (Addendum)
Preventive Care 40-60 Years Old, Female Preventive care refers to visits with your health care provider and lifestyle choices that can promote health and wellness. This includes:  A yearly physical exam. This may also be called an annual well check.  Regular dental visits and eye exams.  Immunizations.  Screening for certain conditions.  Healthy lifestyle choices, such as eating a healthy diet, getting regular exercise, not using drugs or products that contain nicotine and tobacco, and limiting alcohol use. What can I expect for my preventive care visit? Physical exam Your health care provider will check your:  Height and weight. This may be used to calculate body mass index (BMI), which tells if you are at a healthy weight.  Heart rate and blood pressure.  Skin for abnormal spots. Counseling Your health care provider may ask you questions about your:  Alcohol, tobacco, and drug use.  Emotional well-being.  Home and relationship well-being.  Sexual activity.  Eating habits.  Work and work environment.  Method of birth control.  Menstrual cycle.  Pregnancy history. What immunizations do I need?  Influenza (flu) vaccine  This is recommended every year. Tetanus, diphtheria, and pertussis (Tdap) vaccine  You may need a Td booster every 10 years. Varicella (chickenpox) vaccine  You may need this if you have not been vaccinated. Zoster (shingles) vaccine  You may need this after age 60. Measles, mumps, and rubella (MMR) vaccine  You may need at least one dose of MMR if you were born in 1957 or later. You may also need a second dose. Pneumococcal conjugate (PCV13) vaccine  You may need this if you have certain conditions and were not previously vaccinated. Pneumococcal polysaccharide (PPSV23) vaccine  You may need one or two doses if you smoke cigarettes or if you have certain conditions. Meningococcal conjugate (MenACWY) vaccine  You may need this if you  have certain conditions. Hepatitis A vaccine  You may need this if you have certain conditions or if you travel or work in places where you may be exposed to hepatitis A. Hepatitis B vaccine  You may need this if you have certain conditions or if you travel or work in places where you may be exposed to hepatitis B. Haemophilus influenzae type b (Hib) vaccine  You may need this if you have certain conditions. Human papillomavirus (HPV) vaccine  If recommended by your health care provider, you may need three doses over 6 months. You may receive vaccines as individual doses or as more than one vaccine together in one shot (combination vaccines). Talk with your health care provider about the risks and benefits of combination vaccines. What tests do I need? Blood tests  Lipid and cholesterol levels. These may be checked every 5 years, or more frequently if you are over 60 years old.  Hepatitis C test.  Hepatitis B test. Screening  Lung cancer screening. You may have this screening every year starting at age 60 if you have a 30-pack-year history of smoking and currently smoke or have quit within the past 15 years.  Colorectal cancer screening. All adults should have this screening starting at age 60 and continuing until age 75. Your health care provider may recommend screening at age 60 if you are at increased risk. You will have tests every 1-10 years, depending on your results and the type of screening test.  Diabetes screening. This is done by checking your blood sugar (glucose) after you have not eaten for a while (fasting). You may have this   done every 1-3 years.  Mammogram. This may be done every 1-2 years. Talk with your health care provider about when you should start having regular mammograms. This may depend on whether you have a family history of breast cancer.  BRCA-related cancer screening. This may be done if you have a family history of breast, ovarian, tubal, or peritoneal  cancers.  Pelvic exam and Pap test. This may be done every 3 years starting at age 60. Starting at age 7, this may be done every 5 years if you have a Pap test in combination with an HPV test. Other tests  Sexually transmitted disease (STD) testing.  Bone density scan. This is done to screen for osteoporosis. You may have this scan if you are at high risk for osteoporosis. Follow these instructions at home: Eating and drinking  Eat a diet that includes fresh fruits and vegetables, whole grains, lean protein, and low-fat dairy.  Take vitamin and mineral supplements as recommended by your health care provider.  Do not drink alcohol if: ? Your health care provider tells you not to drink. ? You are pregnant, may be pregnant, or are planning to become pregnant.  If you drink alcohol: ? Limit how much you have to 0-1 drink a day. ? Be aware of how much alcohol is in your drink. In the [place] one drink equals one 12 oz bottle of beer (355 mL), one 5 oz glass of wine (148 mL), or one 1 oz glass of hard liquor (44 mL). Lifestyle  Take daily care of your teeth and gums.  Stay active. Exercise for at least 30 minutes on 5 or more days each week.  Do not use any products that contain nicotine or tobacco, such as cigarettes, e-cigarettes, and chewing tobacco. If you need help quitting, ask your health care provider.  If you are sexually active, practice safe sex. Use a condom or other form of birth control (contraception) in order to prevent pregnancy and STIs (sexually transmitted infections).  If told by your health care provider, take low-dose aspirin daily starting at age 60. What's next?  Visit your health care provider once a year for a well check visit.  Ask your health care provider how often you should have your eyes and teeth checked.  Stay up to date on all vaccines. This information is not intended to replace advice given to you by your health care provider. Make sure you  discuss any questions you have with your health care provider. Document Revised: 12/27/2017 Document Reviewed: 12/27/2017 Elsevier Patient Education  2020 [place] Breast self-awareness is knowing how your breasts look and feel. Doing breast self-awareness is important. It allows you to catch a breast problem early while it is still small and can be treated. All women should do breast self-awareness, including women who have had breast implants. Tell your doctor if you notice a change in your breasts. What you need:  A mirror.  A well-lit room. How to do a breast self-exam A breast self-exam is one way to learn what is normal for your breasts and to check for changes. To do a breast self-exam: Look for changes  1. Take off all the clothes above your waist. 2. Stand in front of a mirror in a room with good lighting. 3. Put your hands on your hips. 4. Push your hands down. 5. Look at your breasts and nipples in the mirror to see if one breast or nipple looks different from the  other. Check to see if: ? The shape of one breast is different. ? The size of one breast is different. ? There are wrinkles, dips, and bumps in one breast and not the other. 6. Look at each breast for changes in the skin, such as: ? Redness. ? Scaly areas. 7. Look for changes in your nipples, such as: ? Liquid around the nipples. ? Bleeding. ? Dimpling. ? Redness. ? A change in where the nipples are. Feel for changes  1. Lie on your back on the floor. 2. Feel each breast. To do this, follow these steps: ? Pick a breast to feel. ? Put the arm closest to that breast above your head. ? Use your other arm to feel the nipple area of your breast. Feel the area with the pads of your three middle fingers by making small circles with your fingers. For the first circle, press lightly. For the second circle, press harder. For the third circle, press even harder. ? Keep making circles with  your fingers at the different pressures as you move down your breast. Stop when you feel your ribs. ? Move your fingers a little toward the center of your body. ? Start making circles with your fingers again, this time going up until you reach your collarbone. ? Keep making up-and-down circles until you reach your armpit. Remember to keep using the three pressures. ? Feel the other breast in the same way. 3. Sit or stand in the tub or shower. 4. With soapy water on your skin, feel each breast the same way you did in step 2 when you were lying on the floor. Write down what you find Writing down what you find can help you remember what to tell your doctor. Write down:  What is normal for each breast.  Any changes you find in each breast, including: ? The kind of changes you find. ? Whether you have pain. ? Size and location of any lumps.  When you last had your menstrual period. General tips  Check your breasts every month.  If you are breastfeeding, the best time to check your breasts is after you feed your baby or after you use a breast pump.  If you get menstrual periods, the best time to check your breasts is 5-7 days after your menstrual period is over.  With time, you will become comfortable with the self-exam, and you will begin to know if there are changes in your breasts. Contact a doctor if you:  See a change in the shape or size of your breasts or nipples.  See a change in the skin of your breast or nipples, such as red or scaly skin.  Have fluid coming from your nipples that is not normal.  Find a lump or thick area that was not there before.  Have pain in your breasts.  Have any concerns about your breast health. Summary  Breast self-awareness includes looking for changes in your breasts, as well as feeling for changes within your breasts.  Breast self-awareness should be done in front of a mirror in a well-lit room.  You should check your breasts every month.  If you get menstrual periods, the best time to check your breasts is 5-7 days after your menstrual period is over.  Let your doctor know of any changes you see in your breasts, including changes in size, changes on the skin, pain or tenderness, or fluid from your nipples that is not normal. This information is not  intended to replace advice given to you by your health care provider. Make sure you discuss any questions you have with your health care provider. Document Revised: 12/04/2017 Document Reviewed: 12/04/2017 Elsevier Patient Education  [place].   Hyperhidrosis Hyperhidrosis is a condition in which the body sweats a lot more than normal (excessively). Sweating is a necessary function for a human body. It is normal to sweat when you are hot, physically active, or anxious. However, hyperhidrosis is sweating to an excessive degree. Although the condition is not a serious one, it can make you feel embarrassed. There are two kinds of hyperhidrosis:  Primary hyperhidrosis. The sweating usually localizes in one part of your body, such as your underarms, or in a few areas, such as your feet, face, underarms, and hands. This is the more common kind of hyperhidrosis.  Secondary hyperhidrosis. This type usually affects your entire body. What are the causes? The cause of this condition depends on the kind of hyperhidrosis that you have.  Primary hyperhidrosis may be caused by sweat glands that are more active than normal.  Secondary hyperhidrosis may be caused by an underlying condition or by taking certain medicines, such as antidepressants or diabetes medicines. Possible conditions that may cause secondary hyperhidrosis include: ? Diabetes. ? Gout. ? Anxiety. ? Obesity. ? Menopause. ? Overactive thyroid (hyperthyroidism). ? Tumors. ? Frostbite. ? Certain types of cancers. ? Alcoholism. ? Injury to your nervous system. ? Stroke. ? Parkinson's disease. What increases the  risk? You are more likely to develop primary hyperhidrosis if you have a family history of the condition. What are the signs or symptoms? Symptoms of this condition include:  Feeling like you are sweating constantly, even while you are not being active.  Having skin that peels or gets paler or softer in the areas where you sweat the most.  Being able to see sweat on your skin. Other symptoms depend on the kind of hyperhidrosis that you have.  Symptoms of primary hyperhidrosis may include: ? Sweating in the same location on both sides of your body. ? Sweating only during the day and not while you are sleeping. ? Sweating in specific areas, such as your underarms, palms, feet, and face.  Symptoms of secondary hyperhidrosis may include: ? Sweating all over your body. ? Sweating even while you sleep. How is this diagnosed? This condition may be diagnosed by:  Medical history.  Physical exam. You may also have other tests, including:  Tests to measure the amount of sweat you produce and to show the areas where you sweat the most. These tests may involve: ? Using color-changing chemicals to show patterns of sweating on the skin. ? Weighing paper that has been applied to the skin. This will show the amount of sweat that your body produces. ? Measuring the amount of water that evaporates from the skin. ? Using infrared technology to show patterns of sweating on the skin.  Tests to check for other conditions that may be causing excess sweating. This may include blood, urine, or imaging tests. How is this treated? Treatment for this condition depends on the kind of hyperhidrosis that you have and the areas of your body that are affected. Your health care provider will also treat any underlying conditions. Treatment may include:  Medicines, such as: ? Antiperspirants. These are medicines that stop sweat. ? Injectable medicines. These may include small injections of botulinum  toxin. ? Oral medicines. These are taken by mouth to treat underlying conditions and  other symptoms.  A procedure to: ? Temporarily turn off the sweat glands in your hands and feet (iontophoresis). ? Remove your sweat glands. ? Cut or destroy the nerves so that they do not send a signal to the sweat glands (sympathectomy). Follow these instructions at home: Lifestyle   Limit or avoid foods or beverages that may increase your risk of sweating, such as: ? Spicy food. ? Caffeine. ? Alcohol. ? Foods that contain monosodium glutamate (MSG).  If your feet sweat: ? Wear sandals when possible. ? Do not wear cotton socks. Wear socks that remove or wick moisture from your feet. ? Wear leather shoes. ? Avoid wearing the same pair of shoes for two days in a row.  Try placing sweat pads under your clothes to prevent underarm sweat from showing.  Keep a journal of your sweat symptoms and when they occur. This may help you identify things that trigger your sweating. General instructions  Take over-the-counter and prescription medicines only as told by your health care provider.  Use antiperspirants as told by your health care provider.  Consider joining a hyperhidrosis support group.  Keep all follow-up visits as told by your health care provider. This is important. Contact a health care provider if:  You have new symptoms.  Your symptoms get worse. Summary  Hyperhidrosis is a condition in which the body sweats a lot more than normal (excessively).  With primary hyperhidrosis, the sweating usually localizes in one part of your body, such as your underarms, or in a few areas, such as your feet, face, underarms, and hands. It is caused by overactive sweat glands in the affected area.  With secondary hyperhidrosis, the sweating affects your entire body. This is caused by an underlying condition.  Treatment for this condition depends on the kind of hyperhidrosis that you have and the  parts of your body that are affected. This information is not intended to replace advice given to you by your health care provider. Make sure you discuss any questions you have with your health care provider. Document Revised: 02/18/2019 Document Reviewed: 04/20/2017 Elsevier Patient Education  2020 [employer].

## 2019-09-23 NOTE — Progress Notes (Signed)
ANNUAL PREVENTATIVE CARE GYNECOLOGY  ENCOUNTER NOTE  Subjective:       Cindy Shepherd is a 59 y.o. [phone] female here for a routine annual gynecologic exam. The patient is sexually active. The patient has never been on taking hormone replacement therapy. Patient denies post-menopausal vaginal bleeding. The patient wears seatbelts: yes. The patient participates in regular exercise: no.  Current complaints: 1.  Reports persistent excessive sweating.  This has been ongoing for several years.  She reports that she sweats excessively in the axillary, breast, and groin/vaginal region.  She has tried several OTC products (J&J powder, several deodorants including Degree) without much relief (although states that she is now using [employer], which has helped the axillary region).    Gynecologic History No LMP recorded. Patient is postmenopausal. Contraception: post menopausal status Last Pap: 06/2018. Results were: abnormal - ASCUS HPV neg Last mammogram: 08/04/2019. Results were: normal Last Colonoscopy: Has never had one.   Last Dexa Scan: Not due    Obstetric History OB History  Gravida Para Term Preterm AB Living  4 4 4     4   SAB TAB Ectopic Multiple Live Births          4    # Outcome Date GA Lbr Len/2nd Weight Sex Delivery Anes PTL Lv  4 Term 60    M Vag-Spont   LIV  3 Term 24    M Vag-Spont   LIV  2 Term 24    M Vag-Spont   LIV  1 Term 57    M Vag-Spont   LIV    Past Medical History:  Diagnosis Date  . Diabetes mellitus without complication (HCC)   . Hypertension     Family History  Problem Relation Age of Onset  . Diabetes Mother   . Hypertension Mother     Past Surgical History:  Procedure Laterality Date  . TUBAL LIGATION  1997    Social History   Socioeconomic History  . Marital status: Married    Spouse name: Not on file  . Number of children: Not on file  . Years of education: Not on file  . Highest education level: Not on file    Occupational History  . Not on file  Tobacco Use  . Smoking status: Never Smoker  . Smokeless tobacco: Never Used  Substance and Sexual Activity  . Alcohol use: Not Currently  . Drug use: Never  . Sexual activity: Not Currently    Birth control/protection: None  Other Topics Concern  . Not on file  Social History Narrative  . Not on file   Social Determinants of Health   Financial Resource Strain:   . Difficulty of Paying Living Expenses:   Food Insecurity:   . Worried About 100 in the Last Year:   . Programme researcher, broadcasting/film/video in the Last Year:   Transportation Needs:   . Barista (Medical):   Freight forwarder Lack of Transportation (Non-Medical):   Physical Activity:   . Days of Exercise per Week:   . Minutes of Exercise per Session:   Stress:   . Feeling of Stress :   Social Connections:   . Frequency of Communication with Friends and Family:   . Frequency of Social Gatherings with Friends and Family:   . Attends Religious Services:   . Active Member of Clubs or Organizations:   . Attends [employer] Meetings:   Banker Marital Status:   Intimate [place]  Violence:   . Fear of Current or Ex-Partner:   . Emotionally Abused:   [place] Physically Abused:   . Sexually Abused:     Current Outpatient Medications on File Prior to Visit  Medication Sig Dispense Refill  . amLODipine (NORVASC) 10 MG tablet Take 10 mg by mouth daily.  0  . JANUMET 50-500 MG tablet Take 1 tablet by mouth 2 (two) times daily.  2   No current facility-administered medications on file prior to visit.    Allergies  Allergen Reactions  . Penicillins     Has patient had a PCN reaction causing immediate rash, facial/tongue/throat swelling, SOB or lightheadedness with hypotension: Yes Has patient had a PCN reaction causing severe rash involving mucus membranes or skin necrosis: Yes Has patient had a PCN reaction that required hospitalization: No Has patient had a PCN reaction occurring  within the last 10 years: No If all of the above answers are "NO", then may proceed with Cephalosporin use.       Review of Systems ROS Review of Systems - General ROS: negative for - chills, fatigue, fever, hot flashes, night sweats, weight gain or weight loss.  Psychological ROS: negative for - anxiety, decreased libido, depression, mood swings, physical abuse or sexual abuse Ophthalmic ROS: negative for - blurry vision, eye pain or loss of vision ENT ROS: negative for - headaches, hearing change, visual changes or vocal changes Allergy and Immunology ROS: negative for - hives, itchy/watery eyes or seasonal allergies Hematological and Lymphatic ROS: negative for - bleeding problems, bruising, swollen lymph nodes or weight loss Endocrine ROS: negative for - galactorrhea, hair pattern changes, hot flashes, malaise/lethargy, mood swings, palpitations, polydipsia/polyuria, skin changes, temperature intolerance or unexpected weight changes. Positive for excessive sweating.  Breast ROS: negative for - new or changing breast lumps or nipple discharge Respiratory ROS: negative for - cough or shortness of breath Cardiovascular ROS: negative for - chest pain, irregular heartbeat, palpitations or shortness of breath Gastrointestinal ROS: no abdominal pain, change in bowel habits, or black or bloody stools Genito-Urinary ROS: no dysuria, trouble voiding, or hematuria Musculoskeletal ROS: negative for - joint pain or joint stiffness Neurological ROS: negative for - bowel and bladder control changes Dermatological ROS: negative for rash and skin lesion changes   Objective:   BP 133/84   Pulse 93   Ht 5' 9.5" (1.765 m)   Wt 285 lb 3.2 oz (129.4 kg)   BMI 41.51 kg/m  CONSTITUTIONAL: Well-developed, well-nourished female in no acute distress. Morbid obesity PSYCHIATRIC: Normal mood and affect. Normal behavior. Normal judgment and thought content. [place]: Alert and oriented to person, place,  and time. Normal muscle tone coordination. No cranial nerve deficit noted. HENT:  Normocephalic, atraumatic, External right and left ear normal. Oropharynx is clear and moist EYES: Conjunctivae and EOM are normal. Pupils are equal, round, and reactive to light. No scleral icterus.  NECK: Normal range of motion, supple, no masses.  Normal thyroid.  SKIN: Skin is warm and dry. No rash noted. Not diaphoretic. No erythema. No pallor. CARDIOVASCULAR: Normal heart rate noted, regular rhythm, no murmur. RESPIRATORY: Clear to auscultation bilaterally. Effort and breath sounds normal, no problems with respiration noted. BREASTS: Symmetric in size. No masses, skin changes, nipple drainage, or lymphadenopathy. ABDOMEN: Soft, normal bowel sounds, no distention noted.  No tenderness, rebound or guarding.  BLADDER: Normal PELVIC:  Bladder no bladder distension noted  Urethra: normal appearing urethra with no masses, tenderness or lesions  Vulva: normal appearing vulva with  no masses, tenderness or lesions  Vagina: normal appearing vagina with normal color and discharge, no lesions  Cervix: normal appearing cervix without discharge or lesions  Uterus: uterus is normal size, shape, consistency and nontender  Adnexa: normal adnexa in size, nontender and no masses  RV: External Exam NormaI, No Rectal Masses and Normal Sphincter tone  MUSCULOSKELETAL: Normal range of motion. No tenderness.  No cyanosis, clubbing, or edema.  2+ distal pulses. LYMPHATIC: No Axillary, Supraclavicular, or Inguinal Adenopathy.   Labs: Lab Results  Component Value Date   WBC 13.1 (H) 09/13/2017   HGB 12.8 09/13/2017   HCT 38.7 09/13/2017   MCV 83.6 09/13/2017   PLT 290 09/13/2017    Lab Results  Component Value Date   CREATININE 0.76 09/13/2017   BUN 11 09/13/2017   NA 139 09/13/2017   K 4.1 09/13/2017   CL 108 09/13/2017   CO2 19 (L) 09/13/2017    Lab Results  Component Value Date   ALT 33 09/13/2017   AST 26  09/13/2017   ALKPHOS 81 09/13/2017   BILITOT 0.9 09/13/2017    No results found for: CHOL, HDL, LDLCALC, LDLDIRECT, TRIG, CHOLHDL  No results found for: TSH  No results found for: HGBA1C   Assessment:   1. Encounter for well woman exam with routine gynecological exam   2. Breast cancer screening by mammogram   3. Colon cancer screening   4. Encounter to establish care   5. Excessive sweating   6. Essential hypertension   7. Type 2 diabetes mellitus without complication, without long-term current use of insulin (HCC)   '  Plan:  Pap: Not needed. Up to date. ASCUS HR HPV neg pap, can continue q 3 year screens.  Mammogram: Ordered Stool Guaiac Testing:  Not ordered. Will place referral to GI for colonoscopy.  Labs: To be performed by PCP. Patient would like a referral to establish with a PCP. Will place order. Usually has labs done by urgent care.  Routine preventative health maintenance measures emphasized: Exercise/Diet/Weight control, Tobacco Warnings, Alcohol/Substance use risks and Stress Management Discussed management options for excessive sweating, including use of other antiperspirants in sweat areas (use of Tussy), or powders (cornstarch or Gold Bond powder).  HTN and DM currently managed on meds.  Return to Clinic - 1 Year. F/u sooner if symptoms persist or do not resolve.    [facility] and Degree as antiperspirants.   Advised on use of Tussy and cornstarch/gold bond powder.    Hildred Laser, MD  [facility]

## 2019-09-24 ENCOUNTER — Encounter: Payer: Self-pay | Admitting: Gastroenterology

## 2019-10-21 ENCOUNTER — Ambulatory Visit (AMBULATORY_SURGERY_CENTER): Payer: Self-pay

## 2019-10-21 ENCOUNTER — Other Ambulatory Visit: Payer: Self-pay

## 2019-10-21 VITALS — Ht 69.5 in | Wt 282.2 lb

## 2019-10-21 DIAGNOSIS — Z1211 Encounter for screening for malignant neoplasm of colon: Secondary | ICD-10-CM

## 2019-10-21 NOTE — Progress Notes (Signed)
No allergies to soy or egg Pt is not on blood thinners or diet pills Denies issues with sedation/intubation Denies atrial flutter/fib Denies constipation   Emmi instructions given to pt  Pt is aware of Covid safety and care partner requirements.  

## 2019-10-22 ENCOUNTER — Encounter: Payer: Self-pay | Admitting: Gastroenterology

## 2019-11-04 ENCOUNTER — Encounter: Payer: 59 | Admitting: Gastroenterology

## 2020-01-22 ENCOUNTER — Other Ambulatory Visit: Payer: Self-pay

## 2020-01-23 ENCOUNTER — Other Ambulatory Visit: Payer: Self-pay

## 2020-01-28 ENCOUNTER — Other Ambulatory Visit: Payer: Self-pay

## 2020-01-28 DIAGNOSIS — Z7689 Persons encountering health services in other specified circumstances: Secondary | ICD-10-CM

## 2020-03-04 ENCOUNTER — Other Ambulatory Visit: Payer: Self-pay

## 2020-03-05 ENCOUNTER — Encounter: Payer: Self-pay | Admitting: Nurse Practitioner

## 2020-03-05 ENCOUNTER — Ambulatory Visit: Payer: 59 | Admitting: Nurse Practitioner

## 2020-03-05 VITALS — BP 130/84 | HR 100 | Temp 96.0°F | Ht 68.75 in | Wt 282.2 lb

## 2020-03-05 DIAGNOSIS — Z13220 Encounter for screening for lipoid disorders: Secondary | ICD-10-CM | POA: Diagnosis not present

## 2020-03-05 DIAGNOSIS — E1165 Type 2 diabetes mellitus with hyperglycemia: Secondary | ICD-10-CM

## 2020-03-05 DIAGNOSIS — I10 Essential (primary) hypertension: Secondary | ICD-10-CM

## 2020-03-05 DIAGNOSIS — Z0001 Encounter for general adult medical examination with abnormal findings: Secondary | ICD-10-CM | POA: Diagnosis not present

## 2020-03-05 DIAGNOSIS — Z136 Encounter for screening for cardiovascular disorders: Secondary | ICD-10-CM | POA: Diagnosis not present

## 2020-03-05 NOTE — Assessment & Plan Note (Addendum)
BP at goal with amlodipine BP Readings from Last 3 Encounters:  03/05/20 130/84  09/23/19 133/84  07/03/18 125/84   Repeat BMP, TSH today Maintain current medication F/up in 39months

## 2020-03-05 NOTE — Assessment & Plan Note (Addendum)
She is unable to remember last HbA1c States she is up to date with eye exam, will obtain records. Current use of janumet Does not check glucose at home  Stable renal and liver function LDL at goal HgbA1c at 6.1: controlled DM, decrease janumet to 1tab daily. F/up in 66months

## 2020-03-05 NOTE — Progress Notes (Signed)
Subjective:    Patient ID: Cindy MoundJennifer S Debroux, female    DOB: 07/03/1960, [age] y.o.   MRN: [id number]  Patient presents today for CPE and eval of chronic conditions  HPI  [facility], last OV >1year ago.  Benign hypertension BP at goal with amlodipine BP Readings from Last 3 Encounters:  03/05/20 130/84  09/23/19 133/84  07/03/18 125/84   Repeat BMP, TSH today Maintain current medication F/up in 3months  Diabetes mellitus (HCC) She is unable to remember last HbA1c States she is up to date with eye exam, will obtain records. Current use of janumet Does not check glucose at home  Stable renal and liver function LDL at goal HgbA1c at 6.1: controlled DM, decrease janumet to 1tab daily. F/up in 3months   Sexual History (orientation,birth control, marital status, STD):married, sexually active, s/p tubal ligation, up to date with mammogram and pelvic exam.  Depression/Suicide[facility] 03/05/2020 01/28/2018 10/23/2017 10/16/2017 10/09/2017  Decreased Interest 1 0 0 0 0  Down, Depressed, Hopeless 1 1 0 0 0  PHQ - 2 Score 2 1 0 0 0  Altered sleeping 3 - - - -  Tired, decreased energy 3 - - - -  Change in appetite 3 - - - -  Feeling bad or failure about yourself  0 - - - -  Trouble concentrating 0 - - - -  Moving slowly or fidgety/restless 0 - - - -  Suicidal thoughts 0 - - - -  PHQ-9 Score 11 - - - -   Vision:up to date, will get records  Dental:she will schedule  Immunizations: (TDAP, Hep C screen, Pneumovax, Influenza, zoster)  Health Maintenance  Topic Date Due  .  Hepatitis C: One time screening is recommended by Center for Disease Control  (CDC) for  adults born from [birth year] through 1965.   Never done  . Pneumococcal vaccine  Never done  . Complete foot exam   Never done  . Eye exam for diabetics  Never done  . COVID-19 Vaccine (1) Never done  . HIV Screening  Never done  . Tetanus Vaccine  Never done  . Colon Cancer  Screening  Never done  . Flu Shot  11/30/2019  . Hemoglobin A1C  09/05/2020  . Urine Protein Check  03/08/2021  . Pap Smear  07/02/2021  . Mammogram  08/03/2021   Diet:regular.  Weight:  Wt Readings from Last 3 Encounters:  03/05/20 282 lb 3.2 oz (128 kg)  10/21/19 282 lb 3.2 oz (128 kg)  09/23/19 285 lb 3.2 oz (129.4 kg)   Fall Risk: Fall Risk  03/05/2020 01/28/2018 10/23/2017 10/16/2017 10/09/2017  Falls in the past year? 0 No No No No   Medications and allergies reviewed with patient and updated if appropriate.  Patient Active Problem List   Diagnosis Date Noted  . Benign hypertension 03/05/2020  . Morbid obesity (HCC) 03/05/2020  . Kidney stone 09/15/2017  . Diabetes mellitus (HCC) 08/27/2017   No current outpatient medications on file prior to visit.   No current facility-administered medications on file prior to visit.   Past Medical History:  Diagnosis Date  . Diabetes mellitus without complication (HCC)   . Hypertension    Past Surgical History:  Procedure Laterality Date  . TUBAL LIGATION  1997   Social History   Socioeconomic History  . Marital status: Married    Spouse name: Not on file  . Number of children: Not on file  .  Years of education: Not on file  . Highest education level: Not on file  Occupational History  . Not on file  Tobacco Use  . Smoking status: Never Smoker  . Smokeless tobacco: Never Used  Vaping Use  . Vaping Use: Never used  Substance and Sexual Activity  . Alcohol use: Not Currently  . Drug use: Never  . Sexual activity: Not Currently    Birth control/protection: None  Other Topics Concern  . Not on file  Social History Narrative  . Not on file   Social Determinants of Health   Financial Resource Strain:   . Difficulty of Paying Living Expenses: Not on file  Food Insecurity:   . Worried About Programme researcher, broadcasting/film/video in the Last Year: Not on file  . Ran Out of Food in the Last Year: Not on file  Transportation Needs:   .  Lack of Transportation (Medical): Not on file  . Lack of Transportation (Non-Medical): Not on file  Physical Activity:   . Days of Exercise per Week: Not on file  . Minutes of Exercise per Session: Not on file  Stress:   . Feeling of Stress : Not on file  Social Connections:   . Frequency of Communication with Friends and Family: Not on file  . Frequency of Social Gatherings with Friends and Family: Not on file  . Attends Religious Services: Not on file  . Active Member of Clubs or Organizations: Not on file  . Attends Banker Meetings: Not on file  . Marital Status: Not on file    Family History  Problem Relation Age of Onset  . Diabetes Mother   . Hypertension Mother   . Colon cancer Neg Hx   . Colon polyps Neg Hx   . Esophageal cancer Neg Hx   . Rectal cancer Neg Hx   . Stomach cancer Neg Hx         Review of Systems  Constitutional: Negative for fever, malaise/fatigue and weight loss.  HENT: Negative for congestion and sore throat.   Eyes:       Negative for visual changes  Respiratory: Negative for cough and shortness of breath.   Cardiovascular: Negative for chest pain, palpitations and leg swelling.  Gastrointestinal: Negative for blood in stool, constipation, diarrhea and heartburn.  Genitourinary: Negative for dysuria, frequency and urgency.  Musculoskeletal: Negative for falls, joint pain and myalgias.  Skin: Negative for rash.  Neurological: Negative for dizziness, sensory change and headaches.  Endo/Heme/Allergies: Does not bruise/bleed easily.  Psychiatric/Behavioral: Negative for depression, substance abuse and suicidal ideas. The patient is not nervous/anxious.    Objective:   Vitals:   03/05/20 0849  BP: 130/84  Pulse: 100  Temp: (!) 96 F (35.6 C)  SpO2: 98%    Body mass index is 41.98 kg/m.   Physical Examination:  Physical Exam Constitutional:      General: She is not in acute distress.    Appearance: She is obese.    HENT:     Right Ear: Tympanic membrane, ear canal and external ear normal.     Left Ear: Tympanic membrane, ear canal and external ear normal.  Eyes:     General: No scleral icterus.    Extraocular Movements: Extraocular movements intact.     Conjunctiva/sclera: Conjunctivae normal.  Neck:     Thyroid: No thyromegaly.  Cardiovascular:     Rate and Rhythm: Normal rate and regular rhythm.     Pulses: Normal pulses.  Heart sounds: Normal heart sounds.  Pulmonary:     Effort: Pulmonary effort is normal.     Breath sounds: Normal breath sounds.  Chest:     Chest wall: No tenderness.  Abdominal:     General: Bowel sounds are normal. There is no distension.     Palpations: Abdomen is soft.     Tenderness: There is no abdominal tenderness.  Genitourinary:    Comments: Deferred breast and pelvic exam to GYN per patient Musculoskeletal:        General: No tenderness. Normal range of motion.     Cervical back: Normal range of motion and neck supple.  Lymphadenopathy:     Cervical: No cervical adenopathy.  Skin:    General: Skin is warm and dry.  Neurological:     Mental Status: She is alert and oriented to person, place, and time.  Psychiatric:        Behavior: Behavior normal.        Judgment: Judgment normal.    ASSESSMENT and PLAN: This visit occurred during the SARS-CoV-2 public health emergency.  Safety protocols were in place, including screening questions prior to the visit, additional usage of staff PPE, and extensive cleaning of exam room while observing appropriate contact time as indicated for disinfecting solutions.   [place] was seen today for establish care.  Diagnoses and all orders for this visit:  Encounter for preventative adult health care exam with abnormal findings -     Cancel: CBC with Differential/Platelet -     Cancel: Comprehensive metabolic panel -     Cancel: Lipid panel -     CBC with Differential/Platelet; Future  Benign hypertension -      Comprehensive metabolic panel; Future -     TSH; Future -     amLODipine (NORVASC) 10 MG tablet; Take 1 tablet (10 mg total) by mouth daily.  Type 2 diabetes mellitus with hyperglycemia, without long-term current use of insulin (HCC) -     Cancel: Hemoglobin A1c -     Cancel: Microalbumin / creatinine urine ratio -     Hemoglobin A1c; Future -     Lipid panel; Future -     Microalbumin / creatinine urine ratio; Future -     JANUMET 50-500 MG tablet; Take 1 tablet by mouth daily after breakfast.  Encounter for lipid screening for cardiovascular disease -     Cancel: Lipid panel  Morbid obesity (HCC) -     Cancel: TSH -     Cancel: Vitamin D 1,25 dihydroxy -     Lipid panel; Future -     TSH; Future -     Cancel: Vitamin D 1,25 dihydroxy; Future      Problem List Items Addressed This Visit      Cardiovascular and Mediastinum   Benign hypertension    BP at goal with amlodipine BP Readings from Last 3 Encounters:  03/05/20 130/84  09/23/19 133/84  07/03/18 125/84   Repeat BMP, TSH today Maintain current medication F/up in 55months      Relevant Medications   amLODipine (NORVASC) 10 MG tablet   Other Relevant Orders   Comprehensive metabolic panel (Completed)   TSH (Completed)     Endocrine   Diabetes mellitus (HCC)    She is unable to remember last HbA1c States she is up to date with eye exam, will obtain records. Current use of janumet Does not check glucose at home  Stable renal and liver function LDL at  goal HgbA1c at 6.1: controlled DM, decrease janumet to 1tab daily. F/up in 39months       Relevant Medications   JANUMET 50-500 MG tablet   Other Relevant Orders   Hemoglobin A1c (Completed)   Lipid panel (Completed)   Microalbumin / creatinine urine ratio (Completed)     Other   Morbid obesity (HCC)   Relevant Medications   JANUMET 50-500 MG tablet   Other Relevant Orders   Lipid panel (Completed)   TSH (Completed)    Other Visit Diagnoses     Encounter for preventative adult health care exam with abnormal findings    -  Primary   Relevant Orders   CBC with Differential/Platelet (Completed)   Encounter for lipid screening for cardiovascular disease          Follow up: Return in about 3 months (around 06/05/2020) for DM and HTN (fasting, ).  Alysia Penna, NP

## 2020-03-05 NOTE — Patient Instructions (Signed)
Thank you for choosing [facility] for your health needs  Sign medical release form to get records from previous pcp and opthalmology  Schedule lab appt. You need to be fasting at least 6-8hrs prior to blood draw.  Schedule appt with GI for colonoscopy.  Health Maintenance, Female Adopting a healthy lifestyle and getting preventive care are important in promoting health and wellness. Ask your health care provider about:  The right schedule for you to have regular tests and exams.  Things you can do on your own to prevent diseases and keep yourself healthy. What should I know about diet, weight, and exercise? Eat a healthy diet   Eat a diet that includes plenty of vegetables, fruits, low-fat dairy products, and lean protein.  Do not eat a lot of foods that are high in solid fats, added sugars, or sodium. Maintain a healthy weight Body mass index (BMI) is used to identify weight problems. It estimates body fat based on height and weight. Your health care provider can help determine your BMI and help you achieve or maintain a healthy weight. Get regular exercise Get regular exercise. This is one of the most important things you can do for your health. Most adults should:  Exercise for at least 150 minutes each week. The exercise should increase your heart rate and make you sweat (moderate-intensity exercise).  Do strengthening exercises at least twice a week. This is in addition to the moderate-intensity exercise.  Spend less time sitting. Even light physical activity can be beneficial. Watch cholesterol and blood lipids Have your blood tested for lipids and cholesterol at 60 years of age, then have this test every 5 years. Have your cholesterol levels checked more often if:  Your lipid or cholesterol levels are high.  You are older than 60 years of age.  You are at high risk for heart disease. What should I know about cancer screening? Depending on your health history  and family history, you may need to have cancer screening at various ages. This may include screening for:  Breast cancer.  Cervical cancer.  Colorectal cancer.  Skin cancer.  Lung cancer. What should I know about heart disease, diabetes, and high blood pressure? Blood pressure and heart disease  High blood pressure causes heart disease and increases the risk of stroke. This is more likely to develop in people who have high blood pressure readings, are of African descent, or are overweight.  Have your blood pressure checked: ? Every 3-5 years if you are 75-46 years of age. ? Every year if you are 21 years old or older. Diabetes Have regular diabetes screenings. This checks your fasting blood sugar level. Have the screening done:  Once every three years after age 36 if you are at a normal weight and have a low risk for diabetes.  More often and at a younger age if you are overweight or have a high risk for diabetes. What should I know about preventing infection? Hepatitis B If you have a higher risk for hepatitis B, you should be screened for this virus. Talk with your health care provider to find out if you are at risk for hepatitis B infection. Hepatitis C Testing is recommended for:  Everyone born from [birth year] through 1966.  Anyone with known risk factors for hepatitis C. Sexually transmitted infections (STIs)  Get screened for STIs, including gonorrhea and chlamydia, if: ? You are sexually active and are younger than 60 years of age. ? You are older than  60 years of age and your health care provider tells you that you are at risk for this type of infection. ? Your sexual activity has changed since you were last screened, and you are at increased risk for chlamydia or gonorrhea. Ask your health care provider if you are at risk.  Ask your health care provider about whether you are at high risk for HIV. Your health care provider may recommend a prescription medicine to help  prevent HIV infection. If you choose to take medicine to prevent HIV, you should first get tested for HIV. You should then be tested every 3 months for as long as you are taking the medicine. Pregnancy  If you are about to stop having your period (premenopausal) and you may become pregnant, seek counseling before you get pregnant.  Take 400 to 800 micrograms (mcg) of folic acid every day if you become pregnant.  Ask for birth control (contraception) if you want to prevent pregnancy. Osteoporosis and menopause Osteoporosis is a disease in which the bones lose minerals and strength with aging. This can result in bone fractures. If you are 11 years old or older, or if you are at risk for osteoporosis and fractures, ask your health care provider if you should:  Be screened for bone loss.  Take a calcium or vitamin D supplement to lower your risk of fractures.  Be given hormone replacement therapy (HRT) to treat symptoms of menopause. Follow these instructions at home: Lifestyle  Do not use any products that contain nicotine or tobacco, such as cigarettes, e-cigarettes, and chewing tobacco. If you need help quitting, ask your health care provider.  Do not use street drugs.  Do not share needles.  Ask your health care provider for help if you need support or information about quitting drugs. Alcohol use  Do not drink alcohol if: ? Your health care provider tells you not to drink. ? You are pregnant, may be pregnant, or are planning to become pregnant.  If you drink alcohol: ? Limit how much you use to 0-1 drink a day. ? Limit intake if you are breastfeeding.  Be aware of how much alcohol is in your drink. In the [place] one drink equals one 12 oz bottle of beer (355 mL), one 5 oz glass of wine (148 mL), or one 1 oz glass of hard liquor (44 mL). General instructions  Schedule regular health, dental, and eye exams.  Stay current with your vaccines.  Tell your health care provider  if: ? You often feel depressed. ? You have ever been abused or do not feel safe at home. Summary  Adopting a healthy lifestyle and getting preventive care are important in promoting health and wellness.  Follow your health care provider's instructions about healthy diet, exercising, and getting tested or screened for diseases.  Follow your health care provider's instructions on monitoring your cholesterol and blood pressure. This information is not intended to replace advice given to you by your health care provider. Make sure you discuss any questions you have with your health care provider. Document Revised: 04/10/2018 Document Reviewed: 04/10/2018 Elsevier Patient Education  2020 [employer].

## 2020-03-08 ENCOUNTER — Other Ambulatory Visit (INDEPENDENT_AMBULATORY_CARE_PROVIDER_SITE_OTHER): Payer: 59

## 2020-03-08 ENCOUNTER — Other Ambulatory Visit: Payer: Self-pay

## 2020-03-08 DIAGNOSIS — I10 Essential (primary) hypertension: Secondary | ICD-10-CM

## 2020-03-08 DIAGNOSIS — E1165 Type 2 diabetes mellitus with hyperglycemia: Secondary | ICD-10-CM | POA: Diagnosis not present

## 2020-03-08 DIAGNOSIS — Z0001 Encounter for general adult medical examination with abnormal findings: Secondary | ICD-10-CM | POA: Diagnosis not present

## 2020-03-08 LAB — CBC WITH DIFFERENTIAL/PLATELET
Basophils Absolute: 0.1 10*3/uL (ref 0.0–0.1)
Basophils Relative: 0.8 % (ref 0.0–3.0)
Eosinophils Absolute: 0.2 10*3/uL (ref 0.0–0.7)
Eosinophils Relative: 2.1 % (ref 0.0–5.0)
HCT: 39.6 % (ref 36.0–46.0)
Hemoglobin: 12.5 g/dL (ref 12.0–15.0)
Lymphocytes Relative: 26.3 % (ref 12.0–46.0)
Lymphs Abs: 2.4 10*3/uL (ref 0.7–4.0)
MCHC: 31.5 g/dL (ref 30.0–36.0)
MCV: 82.5 fl (ref 78.0–100.0)
Monocytes Absolute: 0.5 10*3/uL (ref 0.1–1.0)
Monocytes Relative: 5.9 % (ref 3.0–12.0)
Neutro Abs: 5.9 10*3/uL (ref 1.4–7.7)
Neutrophils Relative %: 64.9 % (ref 43.0–77.0)
Platelets: 260 10*3/uL (ref 150.0–400.0)
RBC: 4.79 Mil/uL (ref 3.87–5.11)
RDW: 14.6 % (ref 11.5–15.5)
WBC: 9.1 10*3/uL (ref 4.0–10.5)

## 2020-03-08 LAB — COMPREHENSIVE METABOLIC PANEL
ALT: 9 U/L (ref 0–35)
AST: 10 U/L (ref 0–37)
Albumin: 4 g/dL (ref 3.5–5.2)
Alkaline Phosphatase: 81 U/L (ref 39–117)
BUN: 10 mg/dL (ref 6–23)
CO2: 24 mEq/L (ref 19–32)
Calcium: 10.4 mg/dL (ref 8.4–10.5)
Chloride: 107 mEq/L (ref 96–112)
Creatinine, Ser: 0.73 mg/dL (ref 0.40–1.20)
GFR: 89.64 mL/min (ref >60.00)
Glucose, Bld: 84 mg/dL (ref 70–99)
Potassium: 4.4 mEq/L (ref 3.5–5.1)
Sodium: 138 mEq/L (ref 135–145)
Total Bilirubin: 0.4 mg/dL (ref 0.2–1.2)
Total Protein: 7.4 g/dL (ref 6.0–8.3)

## 2020-03-08 LAB — LIPID PANEL
Cholesterol: 120 mg/dL (ref 0–200)
HDL: 36.1 mg/dL — ABNORMAL LOW (ref >39.00)
LDL Cholesterol: 73 mg/dL (ref 0–99)
NonHDL: 83.85
Total CHOL/HDL Ratio: 3
Triglycerides: 54 mg/dL (ref 0.0–149.0)
VLDL: 10.8 mg/dL (ref 0.0–40.0)

## 2020-03-08 LAB — MICROALBUMIN / CREATININE URINE RATIO
Creatinine,U: 216.6 mg/dL
Microalb Creat Ratio: 0.9 mg/g (ref 0.0–30.0)
Microalb, Ur: 2 mg/dL — ABNORMAL HIGH (ref 0.0–1.9)

## 2020-03-08 LAB — HEMOGLOBIN A1C: Hgb A1c MFr Bld: 6.1 % (ref 4.6–6.5)

## 2020-03-08 LAB — TSH: TSH: 1.52 u[IU]/mL (ref 0.35–4.50)

## 2020-03-09 MED ORDER — AMLODIPINE BESYLATE 10 MG PO TABS: 10.0000 mg | ORAL_TABLET | Freq: Every day | ORAL | 3 refills | Status: DC

## 2020-03-09 MED ORDER — JANUMET 50-500 MG PO TABS: 1.0000 | ORAL_TABLET | Freq: Every day | ORAL | 3 refills | Status: DC

## 2020-03-11 LAB — VITAMIN D 1,25 DIHYDROXY
Vitamin D 1, 25 (OH)2 Total: 75 pg/mL — ABNORMAL HIGH (ref 18–72)
Vitamin D2 1, 25 (OH)2: <8 pg/mL
Vitamin D3 1, 25 (OH)2: 75 pg/mL

## 2020-06-07 ENCOUNTER — Other Ambulatory Visit: Payer: Self-pay

## 2020-06-08 ENCOUNTER — Ambulatory Visit: Payer: 59 | Admitting: Nurse Practitioner

## 2020-06-09 ENCOUNTER — Telehealth: Payer: Self-pay | Admitting: Nurse Practitioner

## 2020-06-09 NOTE — Telephone Encounter (Signed)
Pt dropped off form to be filled out by provider, I put in Charlotte's folder up front

## 2020-06-14 DIAGNOSIS — Z0279 Encounter for issue of other medical certificate: Secondary | ICD-10-CM

## 2020-07-09 ENCOUNTER — Other Ambulatory Visit: Payer: Self-pay

## 2020-07-09 ENCOUNTER — Encounter: Payer: Self-pay | Admitting: Nurse Practitioner

## 2020-07-09 ENCOUNTER — Ambulatory Visit: Payer: 59 | Admitting: Nurse Practitioner

## 2020-07-09 VITALS — BP 136/82 | HR 85 | Temp 96.9°F | Ht 68.75 in | Wt 279.2 lb

## 2020-07-09 DIAGNOSIS — I10 Essential (primary) hypertension: Secondary | ICD-10-CM | POA: Diagnosis not present

## 2020-07-09 DIAGNOSIS — E1165 Type 2 diabetes mellitus with hyperglycemia: Secondary | ICD-10-CM

## 2020-07-09 DIAGNOSIS — M5412 Radiculopathy, cervical region: Secondary | ICD-10-CM | POA: Diagnosis not present

## 2020-07-09 LAB — HEMOGLOBIN A1C: Hgb A1c MFr Bld: 5.8 % (ref 4.6–6.5)

## 2020-07-09 LAB — BASIC METABOLIC PANEL
BUN: 12 mg/dL (ref 6–23)
CO2: 24 mEq/L (ref 19–32)
Calcium: 10.4 mg/dL (ref 8.4–10.5)
Chloride: 107 mEq/L (ref 96–112)
Creatinine, Ser: 0.79 mg/dL (ref 0.40–1.20)
GFR: 81.34 mL/min (ref >60.00)
Glucose, Bld: 92 mg/dL (ref 70–99)
Potassium: 3.9 mEq/L (ref 3.5–5.1)
Sodium: 139 mEq/L (ref 135–145)

## 2020-07-09 MED ORDER — MELOXICAM 7.5 MG PO TABS: 7.5000 mg | ORAL_TABLET | Freq: Every day | ORAL | 0 refills | Status: DC

## 2020-07-09 MED ORDER — TIZANIDINE HCL 2 MG PO TABS: 2.0000 mg | ORAL_TABLET | Freq: Every day | ORAL | 0 refills | Status: DC

## 2020-07-09 NOTE — Progress Notes (Signed)
Subjective:  Patient ID: Cindy Shepherd, female    DOB: March 02, 1961  Age: 60 y.o. MRN: [id number]  CC: Follow-up (3 month f/u on DM and HTN. Pt is fasting today. )  Neck Pain  This is a new problem. The problem occurs intermittently. The problem has been waxing and waning. The pain is associated with an unknown factor. The pain is present in the occipital region. The quality of the pain is described as cramping. The symptoms are aggravated by twisting and position. The pain is worse during the night. Associated symptoms include tingling. Pertinent negatives include no chest pain, fever, headaches, leg pain, numbness, pain with swallowing, paresis, photophobia, syncope, trouble swallowing, visual change, weakness or weight loss. Associated symptoms comments: Tingling in both arms. She has tried nothing for the symptoms.   Diabetes mellitus (HCC) Controlled Normal foot exam today Reports she had diabetic eye exam by [facility] (advised to have report faxed) No adverse effects with janumet. Repeat Hgb A1c and BMP  Benign hypertension BP at goal with amlodipine BP Readings from Last 3 Encounters:  07/09/20 136/82  03/05/20 130/84  09/23/19 133/84   repeat BMP Continue current medication  Wt Readings from Last 3 Encounters:  07/09/20 279 lb 3.2 oz (126.6 kg)  03/05/20 282 lb 3.2 oz (128 kg)  10/21/19 282 lb 3.2 oz (128 kg)   Reviewed past Medical, Social and Family history today.  Outpatient Medications Prior to Visit  Medication Sig Dispense Refill  . amLODipine (NORVASC) 10 MG tablet Take 1 tablet (10 mg total) by mouth daily. 90 tablet 3  . JANUMET 50-500 MG tablet Take 1 tablet by mouth daily after breakfast. 90 tablet 3   No facility-administered medications prior to visit.   ROS See HPI  Objective:  BP 136/82 (BP Location: Left Arm, Patient Position: Sitting, Cuff Size: Large)   Pulse 85   Temp (!) 96.9 F (36.1 C) (Temporal)   Ht 5' 8.75" (1.746 m)   Wt 279 lb 3.2  oz (126.6 kg)   SpO2 98%   BMI 41.53 kg/m   Physical Exam Constitutional:      Appearance: She is obese.  Cardiovascular:     Rate and Rhythm: Normal rate.     Pulses: Normal pulses.  Pulmonary:     Effort: Pulmonary effort is normal.  Musculoskeletal:        General: No swelling or tenderness.     Right shoulder: Normal.     Left shoulder: Normal.     Right upper arm: Normal.     Left upper arm: Normal.     Cervical back: Normal.     Right lower leg: No edema.     Left lower leg: No edema.  Skin:    General: Skin is warm and dry.     Comments: Normal foot exam  Neurological:     Mental Status: She is alert and oriented to person, place, and time.    Assessment & Plan:  This visit occurred during the SARS-CoV-2 public health emergency.  Safety protocols were in place, including screening questions prior to the visit, additional usage of staff PPE, and extensive cleaning of exam room while observing appropriate contact time as indicated for disinfecting solutions.   Ketrina was seen today for follow-up.  Diagnoses and all orders for this visit:  Benign hypertension -     Basic metabolic panel  Type 2 diabetes mellitus with hyperglycemia, without long-term current use of insulin (HCC) -  Hemoglobin A1c  Cervical radiculopathy -     meloxicam (MOBIC) 7.5 MG tablet; Take 1 tablet (7.5 mg total) by mouth daily. With food -     tiZANidine (ZANAFLEX) 2 MG tablet; Take 1-2 tablets (2-4 mg total) by mouth at bedtime.   Problem List Items Addressed This Visit      Cardiovascular and Mediastinum   Benign hypertension - Primary    BP at goal with amlodipine BP Readings from Last 3 Encounters:  07/09/20 136/82  03/05/20 130/84  09/23/19 133/84   repeat BMP Continue current medication      Relevant Orders   Basic metabolic panel     Endocrine   Diabetes mellitus (HCC)    Controlled Normal foot exam today Reports she had diabetic eye exam by [facility]  (advised to have report faxed) No adverse effects with janumet. Repeat Hgb A1c and BMP      Relevant Orders   Hemoglobin A1c     Nervous and Auditory   Cervical radiculopathy   Relevant Medications   meloxicam (MOBIC) 7.5 MG tablet   tiZANidine (ZANAFLEX) 2 MG tablet      Follow-up: Return in about 6 months (around 01/09/2021) for DM and HTN (fasting).  Alysia Penna, NP

## 2020-07-09 NOTE — Patient Instructions (Addendum)
Go to lab for blood draw Schedule appt with ophthalmology,and GI  Cervical Radiculopathy  Cervical radiculopathy means that a nerve in the neck (a cervical nerve) is pinched or bruised. This can happen because of an injury to the cervical spine (vertebrae) in the neck, or as a normal part of getting older. This can cause pain or loss of feeling (numbness) that runs from your neck all the way down to your arm and fingers. Often, this condition gets better with rest. Treatment may be needed if the condition does not get better. What are the causes?  A neck injury.  A bulging disk in your spine.  Muscle movements that you cannot control (muscle spasms).  Tight muscles in your neck due to overuse.  Arthritis.  Breakdown in the bones and joints of the spine (spondylosis) due to getting older.  Bone spurs that form near the nerves in the neck. What are the signs or symptoms?  Pain. The pain may: ? Run from the neck to the arm and hand. ? Be very bad or irritating. ? Be worse when you move your neck.  Loss of feeling or tingling in your arm or hand.  Weakness in your arm or hand, in very bad cases. How is this treated? In many cases, treatment is not needed for this condition. With rest, the condition often gets better over time. If treatment is needed, options may include:  Wearing a soft neck collar (cervical collar) for short periods of time, as told by your doctor.  Doing exercises (physical therapy) to strengthen your neck muscles.  Taking medicines.  Having shots (injections) in your spine, in very bad cases.  Having surgery. This may be needed if other treatments do not help. The type of surgery that is used depends on the cause of your condition. Follow these instructions at home: If you have a soft neck collar:  Wear it as told by your doctor. Remove it only as told by your doctor.  Ask your doctor if you can remove the collar for cleaning and bathing. If you are  allowed to remove the collar for cleaning or bathing: ? Follow instructions from your doctor about how to remove the collar safely. ? Clean the collar by wiping it with mild soap and water and drying it completely. ? Take out any removable pads in the collar every 1-2 days. Wash them by hand with soap and water. Let them air-dry completely before you put them back in the collar. ? Check your skin under the collar for redness or sores. If you see any, tell your doctor. Managing pain  Take over-the-counter and prescription medicines only as told by your doctor.  If told, put ice on the painful area. ? If you have a soft neck collar, remove it as told by your doctor. ? Put ice in a plastic bag. ? Place a towel between your skin and the bag. ? Leave the ice on for 20 minutes, 2-3 times a day.  If using ice does not help, you can try using heat. Use the heat source that your doctor recommends, such as a moist heat pack or a heating pad. ? Place a towel between your skin and the heat source. ? Leave the heat on for 20-30 minutes. ? Remove the heat if your skin turns bright red. This is very important if you are unable to feel pain, heat, or cold. You may have a greater risk of getting burned.  You may try a  gentle neck and shoulder rub (massage).      Activity  Rest as needed.  Return to your normal activities as told by your doctor. Ask your doctor what activities are safe for you.  Do exercises as told by your doctor or physical therapist.  Do not lift anything that is heavier than 10 lb (4.5 kg) until your doctor tells you that it is safe. General instructions  Use a flat pillow when you sleep.  Do not drive while wearing a soft neck collar. If you do not have a soft neck collar, ask your doctor if it is safe to drive while your neck heals.  Ask your doctor if the medicine prescribed to you requires you to avoid driving or using heavy machinery.  Do not use any products that  contain nicotine or tobacco, such as cigarettes, e-cigarettes, and chewing tobacco. These can delay healing. If you need help quitting, ask your doctor.  Keep all follow-up visits as told by your doctor. This is important. Contact a doctor if:  Your condition does not get better with treatment. Get help right away if:  Your pain gets worse and is not helped with medicine.  You lose feeling or feel weak in your hand, arm, face, or leg.  You have a high fever.  You have a stiff neck.  You cannot control when you poop or pee (have incontinence).  You have trouble with walking, balance, or talking. Summary  Cervical radiculopathy means that a nerve in the neck is pinched or bruised.  A nerve can get pinched from a bulging disk, arthritis, an injury to the neck, or other causes.  Symptoms include pain, tingling, or loss of feeling that goes from the neck into the arm or hand.  Weakness in your arm or hand can happen in very bad cases.  Treatment may include resting, wearing a soft neck collar, and doing exercises. You might need to take medicines for pain. In very bad cases, shots or surgery may be needed. This information is not intended to replace advice given to you by your health care provider. Make sure you discuss any questions you have with your health care provider. Document Revised: 03/08/2018 Document Reviewed: 03/08/2018 Elsevier Patient Education  2021 [employer].

## 2020-07-09 NOTE — Assessment & Plan Note (Addendum)
Controlled Normal foot exam today Reports she had diabetic eye exam by [facility] (advised to have report faxed) No adverse effects with janumet. Repeat Hgb A1c and BMP

## 2020-07-09 NOTE — Assessment & Plan Note (Signed)
BP at goal with amlodipine BP Readings from Last 3 Encounters:  07/09/20 136/82  03/05/20 130/84  09/23/19 133/84   repeat BMP Continue current medication

## 2020-07-13 ENCOUNTER — Telehealth: Payer: Self-pay | Admitting: Nurse Practitioner

## 2020-07-13 ENCOUNTER — Other Ambulatory Visit: Payer: Self-pay

## 2020-07-13 ENCOUNTER — Emergency Department (HOSPITAL_COMMUNITY)
Admission: EM | Admit: 2020-07-13 | Discharge: 2020-07-13 | Disposition: A | Discharge status: Home / Self Care | Payer: 59 | Attending: Emergency Medicine | Admitting: Emergency Medicine

## 2020-07-13 ENCOUNTER — Encounter (HOSPITAL_COMMUNITY): Payer: Self-pay | Admitting: Emergency Medicine

## 2020-07-13 DIAGNOSIS — Z79899 Other long term (current) drug therapy: Secondary | ICD-10-CM | POA: Diagnosis not present

## 2020-07-13 DIAGNOSIS — W57XXXA Bitten or stung by nonvenomous insect and other nonvenomous arthropods, initial encounter: Secondary | ICD-10-CM | POA: Diagnosis not present

## 2020-07-13 DIAGNOSIS — Z7984 Long term (current) use of oral hypoglycemic drugs: Secondary | ICD-10-CM | POA: Diagnosis not present

## 2020-07-13 DIAGNOSIS — I10 Essential (primary) hypertension: Secondary | ICD-10-CM | POA: Diagnosis not present

## 2020-07-13 DIAGNOSIS — S20161A Insect bite (nonvenomous) of breast, right breast, initial encounter: Secondary | ICD-10-CM | POA: Diagnosis not present

## 2020-07-13 DIAGNOSIS — E119 Type 2 diabetes mellitus without complications: Secondary | ICD-10-CM | POA: Insufficient documentation

## 2020-07-13 NOTE — Discharge Instructions (Signed)
You may apply topical hydrocortisone cream and a topical antibiotic to the site of the bite.  Please return to the ER if you worsening rash, throat closing, shortness of breath, fevers, or any other new or concerning symptoms.

## 2020-07-13 NOTE — Telephone Encounter (Signed)
Pt called and said she was bit by a spider and wasn't sure what to do, I transferred to nurse triage

## 2020-07-13 NOTE — ED Provider Notes (Signed)
[facility] EMERGENCY DEPARTMENT Provider Note   CSN: [id number] Arrival date & time: 07/13/20  1723     History Chief Complaint  Patient presents with  . Insect Bite    Cindy Shepherd is a 60 y.o. female.  HPI 60 year old female with DM type II, hypertension presents to the ER with an insect bite to her right breast.  She was able to kill the insect and put it in a Ziploc bag.  She was concerned that this was a spider bite.  She called the on-call PCP nurse and reported that it was burning and stinging.  She was told to come to the ER.  She denies any symptoms of anaphylaxis including shortness of breath, throat closing, rash    Past Medical History:  Diagnosis Date  . Diabetes mellitus without complication (HCC)   . Hypertension     Patient Active Problem List   Diagnosis Date Noted  . Cervical radiculopathy 07/09/2020  . Benign hypertension 03/05/2020  . Morbid obesity (HCC) 03/05/2020  . Kidney stone 09/15/2017  . Diabetes mellitus (HCC) 08/27/2017    Past Surgical History:  Procedure Laterality Date  . TUBAL LIGATION  1997     OB History    Gravida  4   Para  4   Term  4   Preterm      AB      Living  4     SAB      IAB      Ectopic      Multiple      Live Births  4           Family History  Problem Relation Age of Onset  . Diabetes Mother   . Hypertension Mother   . Colon cancer Neg Hx   . Colon polyps Neg Hx   . Esophageal cancer Neg Hx   . Rectal cancer Neg Hx   . Stomach cancer Neg Hx     Social History   Tobacco Use  . Smoking status: Never Smoker  . Smokeless tobacco: Never Used  Vaping Use  . Vaping Use: Never used  Substance Use Topics  . Alcohol use: Not Currently  . Drug use: Never    Home Medications Prior to Admission medications   Medication Sig Start Date End Date Taking? Authorizing Provider  amLODipine (NORVASC) 10 MG tablet Take 1 tablet (10 mg total) by mouth daily. 03/09/20    Nche, Bonna Gains, NP  JANUMET 50-500 MG tablet Take 1 tablet by mouth daily after breakfast. 03/09/20   Nche, Bonna Gains, NP  meloxicam (MOBIC) 7.5 MG tablet Take 1 tablet (7.5 mg total) by mouth daily. With food 07/09/20   Nche, Bonna Gains, NP  tiZANidine (ZANAFLEX) 2 MG tablet Take 1-2 tablets (2-4 mg total) by mouth at bedtime. 07/09/20   Nche, Bonna Gains, NP    Allergies    Penicillins  Review of Systems   Review of Systems  Skin: Positive for color change and wound.    Physical Exam Updated Vital Signs BP (!) 157/86 (BP Location: Left Arm)   Pulse 95   Temp 97.9 F (36.6 C)   Resp 18   SpO2 98%   Physical Exam Vitals and nursing note reviewed.  Constitutional:      General: She is not in acute distress.    Appearance: She is well-developed.     Comments: Well-appearing, no acute distress, speaking full sentences without increased work of  HENT:     Head: Normocephalic and atraumatic.     Mouth/Throat:     Mouth: Mucous membranes are moist.     Pharynx: Oropharynx is clear.     Comments: Oropharynx non erythematous without exudates, uvula midline, no unilateral tonsillar swelling, tongue normal size and midline, no sublingual/submandibular swellimg, tolerating secretions well    Eyes:     Conjunctiva/sclera: Conjunctivae normal.  Cardiovascular:     Rate and Rhythm: Normal rate and regular rhythm.     Heart sounds: No murmur heard.   Pulmonary:     Effort: Pulmonary effort is normal. No respiratory distress.     Breath sounds: Normal breath sounds.  Abdominal:     Palpations: Abdomen is soft.     Tenderness: There is no abdominal tenderness.  Musculoskeletal:     Cervical back: Neck supple.  Skin:    General: Skin is warm and dry.     Comments: No visible insect bite to the right breast, no overlying erythema, swelling, fluctuance, areas of necrosis.  No macular rash  Neurological:     Mental Status: She is alert.     ED Results / Procedures /  Treatments   Labs (all labs ordered are listed, but only abnormal results are displayed) Labs Reviewed - No data to display  EKG None  Radiology No results found.  Procedures Procedures   Medications Ordered in ED Medications - No data to display  ED Course  I have reviewed the triage vital signs and the nursing notes.  Pertinent labs & imaging results that were available during my care of the patient were reviewed by me and considered in my medical decision making (see chart for details).    MDM Rules/Calculators/A&P                          Patient with insect bite to the right breast.  No evidence of bite on my exam, no evidence of cellulitis, abscess, no evidence of anaphylaxis.  Patient brought the insect in the back, does not appear to be a spider.  Patient was instructed to apply topical hydrocortisone cream for itching, topical antibiotic if desired.  Return precautions discussed.  She was understanding scribbled Final Clinical Impression(s) / ED Diagnoses Final diagnoses:  Insect bite of right breast, initial encounter    Rx / DC Orders ED Discharge Orders    None       Leone Brand 07/13/20 1950    Terald Sleeper, MD 07/14/20 [phone]

## 2020-07-13 NOTE — ED Triage Notes (Signed)
Small insect bite to R breast.  States she called her PCP and was told to come to ED.  Denies SOB and itching.  C/o area stinging.

## 2020-07-13 NOTE — ED Notes (Signed)
Pt left without receiving discharge papers.

## 2020-08-06 ENCOUNTER — Inpatient Hospital Stay: Admission: RE | Admit: 2020-08-06 | Payer: 59 | Source: Ambulatory Visit

## 2020-08-23 ENCOUNTER — Other Ambulatory Visit: Payer: Self-pay | Admitting: Obstetrics and Gynecology

## 2020-08-23 ENCOUNTER — Other Ambulatory Visit: Payer: Self-pay | Admitting: Nurse Practitioner

## 2020-08-23 DIAGNOSIS — E1165 Type 2 diabetes mellitus with hyperglycemia: Secondary | ICD-10-CM

## 2020-08-23 DIAGNOSIS — Z1231 Encounter for screening mammogram for malignant neoplasm of breast: Secondary | ICD-10-CM

## 2020-08-23 NOTE — Telephone Encounter (Signed)
Last OV 03./11/22 Last fill 03/09/20  #90/3

## 2020-10-12 ENCOUNTER — Ambulatory Visit
Admission: RE | Admit: 2020-10-12 | Discharge: 2020-10-12 | Disposition: A | Discharge status: Home / Self Care | Payer: 59 | Source: Ambulatory Visit | Attending: Obstetrics and Gynecology | Admitting: Obstetrics and Gynecology

## 2020-10-12 ENCOUNTER — Other Ambulatory Visit: Payer: Self-pay

## 2020-10-12 DIAGNOSIS — Z1231 Encounter for screening mammogram for malignant neoplasm of breast: Secondary | ICD-10-CM

## 2020-10-20 ENCOUNTER — Other Ambulatory Visit: Payer: Self-pay | Admitting: Nurse Practitioner

## 2020-10-20 DIAGNOSIS — E1165 Type 2 diabetes mellitus with hyperglycemia: Secondary | ICD-10-CM

## 2020-10-26 NOTE — Telephone Encounter (Signed)
Last seen on 07/09/20 Due for visit around 01/09/21 Chart supports Rx

## 2020-11-10 ENCOUNTER — Encounter: Payer: Self-pay | Admitting: Obstetrics and Gynecology

## 2020-11-10 ENCOUNTER — Other Ambulatory Visit: Payer: Self-pay

## 2020-11-10 ENCOUNTER — Ambulatory Visit (INDEPENDENT_AMBULATORY_CARE_PROVIDER_SITE_OTHER): Payer: 59 | Admitting: Obstetrics and Gynecology

## 2020-11-10 VITALS — BP 136/83 | HR 88 | Ht 68.75 in | Wt 285.5 lb

## 2020-11-10 DIAGNOSIS — E119 Type 2 diabetes mellitus without complications: Secondary | ICD-10-CM

## 2020-11-10 DIAGNOSIS — I10 Essential (primary) hypertension: Secondary | ICD-10-CM | POA: Diagnosis not present

## 2020-11-10 DIAGNOSIS — Z01419 Encounter for gynecological examination (general) (routine) without abnormal findings: Secondary | ICD-10-CM | POA: Diagnosis not present

## 2020-11-10 DIAGNOSIS — Z1211 Encounter for screening for malignant neoplasm of colon: Secondary | ICD-10-CM

## 2020-11-10 DIAGNOSIS — N951 Menopausal and female climacteric states: Secondary | ICD-10-CM

## 2020-11-10 NOTE — Patient Instructions (Signed)
Breast Self-Awareness Breast self-awareness is knowing how your breasts look and feel. Doing breast self-awareness is important. It allows you to catch a breast problem early while it is still small and can be treated. All women should do breast self-awareness, including women who have had breast implants. Tell your doctorif you notice a change in your breasts. What you need: A mirror. A well-lit room. How to do a breast self-exam A breast self-exam is one way to learn what is normal for your breasts and tocheck for changes. To do a breast self-exam: Look for changes  Take off all the clothes above your waist. Stand in front of a mirror in a room with good lighting. Put your hands on your hips. Push your hands down. Look at your breasts and nipples in the mirror to see if one breast or nipple looks different from the other. Check to see if: The shape of one breast is different. The size of one breast is different. There are wrinkles, dips, and bumps in one breast and not the other. Look at each breast for changes in the skin, such as: Redness. Scaly areas. Look for changes in your nipples, such as: Liquid around the nipples. Bleeding. Dimpling. Redness. A change in where the nipples are.  Feel for changes  Lie on your back on the floor. Feel each breast. To do this, follow these steps: Pick a breast to feel. Put the arm closest to that breast above your head. Use your other arm to feel the nipple area of your breast. Feel the area with the pads of your three middle fingers by making small circles with your fingers. For the first circle, press lightly. For the second circle, press harder. For the third circle, press even harder. Keep making circles with your fingers at the different pressures as you move down your breast. Stop when you feel your ribs. Move your fingers a little toward the center of your body. Start making circles with your fingers again, this time going up until  you reach your collarbone. Keep making up-and-down circles until you reach your armpit. Remember to keep using the three pressures. Feel the other breast in the same way. Sit or stand in the tub or shower. With soapy water on your skin, feel each breast the same way you did in step 2 when you were lying on the floor.  Write down what you find Writing down what you find can help you remember what to tell your doctor. Write down: What is normal for each breast. Any changes you find in each breast, including: The kind of changes you find. Whether you have pain. Size and location of any lumps. When you last had your menstrual period. General tips Check your breasts every month. If you are breastfeeding, the best time to check your breasts is after you feed your baby or after you use a breast pump. If you get menstrual periods, the best time to check your breasts is 5-7 days after your menstrual period is over. With time, you will become comfortable with the self-exam, and you will begin to know if there are changes in your breasts. Contact a doctor if you: See a change in the shape or size of your breasts or nipples. See a change in the skin of your breast or nipples, such as red or scaly skin. Have fluid coming from your nipples that is not normal. Find a lump or thick area that was not there before. Have pain in   your breasts. Have any concerns about your breast health. Summary Breast self-awareness includes looking for changes in your breasts, as well as feeling for changes within your breasts. Breast self-awareness should be done in front of a mirror in a well-lit room. You should check your breasts every month. If you get menstrual periods, the best time to check your breasts is 5-7 days after your menstrual period is over. Let your doctor know of any changes you see in your breasts, including changes in size, changes on the skin, pain or tenderness, or fluid from your nipples that is  not normal. This information is not intended to replace advice given to you by your health care provider. Make sure you discuss any questions you have with your healthcare provider. Document Revised: 12/04/2017 Document Reviewed: 12/04/2017 Elsevier Patient Education  2022 [employer]     Preventive Care 40-64 Years Old, Female Preventive care refers to lifestyle choices and visits with your health care provider that can promote health and wellness. This includes: A yearly physical exam. This is also called an annual wellness visit. Regular dental and eye exams. Immunizations. Screening for certain conditions. Healthy lifestyle choices, such as: Eating a healthy diet. Getting regular exercise. Not using drugs or products that contain nicotine and tobacco. Limiting alcohol use. What can I expect for my preventive care visit? Physical exam Your health care provider will check your: Height and weight. These may be used to calculate your BMI (body mass index). BMI is a measurement that tells if you are at a healthy weight. Heart rate and blood pressure. Body temperature. Skin for abnormal spots. Counseling Your health care provider may ask you questions about your: Past medical problems. Family's medical history. Alcohol, tobacco, and drug use. Emotional well-being. Home life and relationship well-being. Sexual activity. Diet, exercise, and sleep habits. Work and work environment. Access to firearms. Method of birth control. Menstrual cycle. Pregnancy history. What immunizations do I need?  Vaccines are usually given at various ages, according to a schedule. Your health care provider will recommend vaccines for you based on your age, medicalhistory, and lifestyle or other factors, such as travel or where you work. What tests do I need? Blood tests Lipid and cholesterol levels. These may be checked every 5 years, or more often if you are over 50 years old. Hepatitis C  test. Hepatitis B test. Screening Lung cancer screening. You may have this screening every year starting at age 55 if you have a 30-pack-year history of smoking and currently smoke or have quit within the past 15 years. Colorectal cancer screening. All adults should have this screening starting at age 50 and continuing until age 75. Your health care provider may recommend screening at age 45 if you are at increased risk. You will have tests every 1-10 years, depending on your results and the type of screening test. Diabetes screening. This is done by checking your blood sugar (glucose) after you have not eaten for a while (fasting). You may have this done every 1-3 years. Mammogram. This may be done every 1-2 years. Talk with your health care provider about when you should start having regular mammograms. This may depend on whether you have a family history of breast cancer. BRCA-related cancer screening. This may be done if you have a family history of breast, ovarian, tubal, or peritoneal cancers. Pelvic exam and Pap test. This may be done every 3 years starting at age 21. Starting at age 30, this may be done every   5 years if you have a Pap test in combination with an HPV test. Other tests STD (sexually transmitted disease) testing, if you are at risk. Bone density scan. This is done to screen for osteoporosis. You may have this scan if you are at high risk for osteoporosis. Talk with your health care provider about your test results, treatment options,and if necessary, the need for more tests. Follow these instructions at home: Eating and drinking  Eat a diet that includes fresh fruits and vegetables, whole grains, lean protein, and low-fat dairy products. Take vitamin and mineral supplements as recommended by your health care provider. Do not drink alcohol if: Your health care provider tells you not to drink. You are pregnant, may be pregnant, or are planning to become pregnant. If  you drink alcohol: Limit how much you have to 0-1 drink a day. Be aware of how much alcohol is in your drink. In the [place] one drink equals one 12 oz bottle of beer (355 mL), one 5 oz glass of wine (148 mL), or one 1 oz glass of hard liquor (44 mL).  Lifestyle Take daily care of your teeth and gums. Brush your teeth every morning and night with fluoride toothpaste. Floss one time each day. Stay active. Exercise for at least 30 minutes 5 or more days each week. Do not use any products that contain nicotine or tobacco, such as cigarettes, e-cigarettes, and chewing tobacco. If you need help quitting, ask your health care provider. Do not use drugs. If you are sexually active, practice safe sex. Use a condom or other form of protection to prevent STIs (sexually transmitted infections). If you do not wish to become pregnant, use a form of birth control. If you plan to become pregnant, see your health care provider for a prepregnancy visit. If told by your health care provider, take low-dose aspirin daily starting at age 50. Find healthy ways to cope with stress, such as: Meditation, yoga, or listening to music. Journaling. Talking to a trusted person. Spending time with friends and family. Safety Always wear your seat belt while driving or riding in a vehicle. Do not drive: If you have been drinking alcohol. Do not ride with someone who has been drinking. When you are tired or distracted. While texting. Wear a helmet and other protective equipment during sports activities. If you have firearms in your house, make sure you follow all gun safety procedures. What's next? Visit your health care provider once a year for an annual wellness visit. Ask your health care provider how often you should have your eyes and teeth checked. Stay up to date on all vaccines. This information is not intended to replace advice given to you by your health care provider. Make sure you discuss any questions you  have with your healthcare provider. Document Revised: 01/20/2020 Document Reviewed: 12/27/2017 Elsevier Patient Education  2022 Elsevier Inc.  

## 2020-11-10 NOTE — Progress Notes (Signed)
Pt present for annual exam. Pt stated that she was doing well. Pt had covid vaccines but unsure of the dates.

## 2020-11-10 NOTE — Progress Notes (Signed)
ANNUAL PREVENTATIVE CARE GYNECOLOGY  ENCOUNTER NOTE  Subjective:       Cindy Shepherd is a 60 y.o. [phone] female here for a routine annual gynecologic exam. The patient is not sexually active. The patient has never been on hormone replacement therapy. Patient denies post-menopausal vaginal bleeding. The patient wears seatbelts: yes. The patient participates in regular exercise: no.  Current complaints: 1.  None.    Gynecologic History No LMP recorded. Patient is postmenopausal. Contraception: post menopausal status Last Pap: 06/2018. Results were: abnormal - ASCUS HPV neg Last mammogram: 10/12/2020. Results were: normal Last Colonoscopy: Has never had one.  Notes going to initial consultation but never scheduled procedure.  Last Dexa Scan: Not due    Obstetric History OB History  Gravida Para Term Preterm AB Living  4 4 4     4   SAB IAB Ectopic Multiple Live Births          4    # Outcome Date GA Lbr Len/2nd Weight Sex Delivery Anes PTL Lv  4 Term 15    M Vag-Spont   LIV  3 Term 43    M Vag-Spont   LIV  2 Term 40    M Vag-Spont   LIV  1 Term 21    M Vag-Spont   LIV    Past Medical History:  Diagnosis Date   Diabetes mellitus without complication (HCC)    Hypertension     Family History  Problem Relation Age of Onset   Diabetes Mother    Hypertension Mother    Colon cancer Neg Hx    Colon polyps Neg Hx    Esophageal cancer Neg Hx    Rectal cancer Neg Hx    Stomach cancer Neg Hx     Past Surgical History:  Procedure Laterality Date   TUBAL LIGATION  1997    Social History   Socioeconomic History   Marital status: Married    Spouse name: Not on file   Number of children: Not on file   Years of education: Not on file   Highest education level: Not on file  Occupational History   Not on file  Tobacco Use   Smoking status: Never   Smokeless tobacco: Never  Vaping Use   Vaping Use: Never used  Substance and Sexual Activity   Alcohol use: Not  Currently   Drug use: Never   Sexual activity: Not Currently    Birth control/protection: None  Other Topics Concern   Not on file  Social History Narrative   Not on file   Social Determinants of Health   Financial Resource Strain: Not on file  Food Insecurity: Not on file  Transportation Needs: Not on file  Physical Activity: Not on file  Stress: Not on file  Social Connections: Not on file  Intimate Partner Violence: Not on file    Current Outpatient Medications on File Prior to Visit  Medication Sig Dispense Refill   amLODipine (NORVASC) 10 MG tablet Take 1 tablet (10 mg total) by mouth daily. 90 tablet 3   JANUMET 50-500 MG tablet TAKE 1 TABLET BY MOUTH TWICE A DAY- NEEDS OFFICE VISIT 60 tablet 1   No current facility-administered medications on file prior to visit.    Allergies  Allergen Reactions   Penicillins     Has patient had a PCN reaction causing immediate rash, facial/tongue/throat swelling, SOB or lightheadedness with hypotension: Yes Has patient had a PCN reaction causing severe rash involving mucus  membranes or skin necrosis: Yes Has patient had a PCN reaction that required hospitalization: No Has patient had a PCN reaction occurring within the last 10 years: No If all of the above answers are "NO", then may proceed with Cephalosporin use.       Review of Systems ROS Review of Systems - General ROS: negative for - chills, fatigue, fever, hot flashes, night sweats, weight gain or weight loss.  Psychological ROS: negative for - anxiety, decreased libido, depression, mood swings, physical abuse or sexual abuse Ophthalmic ROS: negative for - blurry vision, eye pain or loss of vision ENT ROS: negative for - headaches, hearing change, visual changes or vocal changes Allergy and Immunology ROS: negative for - hives, itchy/watery eyes or seasonal allergies Hematological and Lymphatic ROS: negative for - bleeding problems, bruising, swollen lymph nodes or weight  loss Endocrine ROS: negative for - galactorrhea, hair pattern changes, hot flashes, malaise/lethargy, mood swings, palpitations, polydipsia/polyuria, skin changes, temperature intolerance or unexpected weight changes.  Breast ROS: negative for - new or changing breast lumps or nipple discharge Respiratory ROS: negative for - cough or shortness of breath Cardiovascular ROS: negative for - chest pain, irregular heartbeat, palpitations or shortness of breath Gastrointestinal ROS: no abdominal pain, change in bowel habits, or black or bloody stools Genito-Urinary ROS: no dysuria, trouble voiding, or hematuria Musculoskeletal ROS: negative for - joint pain or joint stiffness Neurological ROS: negative for - bowel and bladder control changes Dermatological ROS: negative for rash and skin lesion changes   Objective:   BP 136/83   Pulse 88   Ht 5' 8.75" (1.746 m)   Wt 285 lb 8 oz (129.5 kg)   BMI 42.47 kg/m  CONSTITUTIONAL: Well-developed, well-nourished female in no acute distress. Morbid obesity PSYCHIATRIC: Normal mood and affect. Normal behavior. Normal judgment and thought content. NEUROLGIC: Alert and oriented to person, place, and time. Normal muscle tone coordination. No cranial nerve deficit noted. HENT:  Normocephalic, atraumatic, External right and left ear normal. Oropharynx is clear and moist EYES: Conjunctivae and EOM are normal. Pupils are equal, round, and reactive to light. No scleral icterus.  NECK: Normal range of motion, supple, no masses.  Normal thyroid.  SKIN: Skin is warm and dry. No rash noted. Not diaphoretic. No erythema. No pallor. CARDIOVASCULAR: Normal heart rate noted, regular rhythm, no murmur. RESPIRATORY: Clear to auscultation bilaterally. Effort and breath sounds normal, no problems with respiration noted. BREASTS: Symmetric in size. No masses, skin changes, nipple drainage, or lymphadenopathy. ABDOMEN: Soft, normal bowel sounds, no distention noted.  No  tenderness, rebound or guarding.  BLADDER: Normal PELVIC:  Bladder no bladder distension noted  Urethra: normal appearing urethra with no masses, tenderness or lesions  Vulva: normal appearing vulva with no masses, tenderness or lesions  Vagina: mildly atrophic appearing vagina with normal discharge, no lesions  Cervix: normal appearing cervix without discharge or lesions  Uterus: uterus is normal size, shape, consistency and nontender  Adnexa: normal adnexa in size, nontender and no masses  RV: External Exam NormaI, No Rectal Masses and Normal Sphincter tone  MUSCULOSKELETAL: Normal range of motion. No tenderness.  No cyanosis, clubbing, or edema.  2+ distal pulses. LYMPHATIC: No Axillary, Supraclavicular, or Inguinal Adenopathy.   Labs: Lab Results  Component Value Date   WBC 9.1 03/08/2020   HGB 12.5 03/08/2020   HCT 39.6 03/08/2020   MCV 82.5 03/08/2020   PLT 260.0 03/08/2020    Lab Results  Component Value Date   CREATININE 0.79 07/09/2020  BUN 12 07/09/2020   NA 139 07/09/2020   K 3.9 07/09/2020   CL 107 07/09/2020   CO2 24 07/09/2020    Lab Results  Component Value Date   ALT 9 03/08/2020   AST 10 03/08/2020   ALKPHOS 81 03/08/2020   BILITOT 0.4 03/08/2020    Lab Results  Component Value Date   CHOL 120 03/08/2020   HDL 36.10 (L) 03/08/2020   LDLCALC 73 03/08/2020   TRIG 54.0 03/08/2020   CHOLHDL 3 03/08/2020    Lab Results  Component Value Date   TSH 1.52 03/08/2020    Lab Results  Component Value Date   HGBA1C 5.8 07/09/2020     Assessment:   1. Encounter for well woman exam with routine gynecological exam   2. Colon cancer screening   3. Essential hypertension   4. Type 2 diabetes mellitus without complication, without long-term current use of insulin (HCC)   5. Menopausal state   '  Plan:  - Pap: Not needed. Up to date, due in 1 year. ASCUS HR HPV neg pap, can continue q 3 year screens.  - Mammogram:  Up to date.  - Stool Guaiac  Testing:  Patient did not get colonoscopy as recommended. Discussed alternative methods for screening, including Cologuard. Patient is interested, will order.  - Labs: To be performed by PCP.  - Routine preventative health maintenance measures emphasized: Exercise/Diet/Weight control, Tobacco Warnings, Alcohol/Substance use risks and Stress Management - HTN and DM currently managed on meds. Patient notes desires to lose weight. Advised to discuss with her PCP regarding diabetic medications that can also aid with weight loss.  - Menopausal state, asymptomatic. Mild vaginal atrophy not bothersome to patient.  - COVID vaccination: did complete vaccination series, cannot recall dates.  - Return to Clinic - 1 Year. F/u sooner if symptoms persist or do not resolve.     Hildred Laser, MD  [facility]

## 2020-12-27 ENCOUNTER — Other Ambulatory Visit: Payer: Self-pay

## 2020-12-27 DIAGNOSIS — E1165 Type 2 diabetes mellitus with hyperglycemia: Secondary | ICD-10-CM

## 2020-12-27 MED ORDER — JANUMET 50-500 MG PO TABS: ORAL_TABLET | ORAL | 0 refills | Status: DC

## 2021-01-11 ENCOUNTER — Other Ambulatory Visit: Payer: Self-pay

## 2021-01-12 ENCOUNTER — Ambulatory Visit: Payer: 59 | Admitting: Nurse Practitioner

## 2021-01-12 ENCOUNTER — Encounter: Payer: Self-pay | Admitting: Nurse Practitioner

## 2021-01-12 VITALS — BP 138/88 | HR 86 | Temp 97.4°F | Ht 69.75 in | Wt 292.9 lb

## 2021-01-12 DIAGNOSIS — Z136 Encounter for screening for cardiovascular disorders: Secondary | ICD-10-CM

## 2021-01-12 DIAGNOSIS — N3001 Acute cystitis with hematuria: Secondary | ICD-10-CM | POA: Diagnosis not present

## 2021-01-12 DIAGNOSIS — Z23 Encounter for immunization: Secondary | ICD-10-CM | POA: Diagnosis not present

## 2021-01-12 DIAGNOSIS — R300 Dysuria: Secondary | ICD-10-CM

## 2021-01-12 DIAGNOSIS — I10 Essential (primary) hypertension: Secondary | ICD-10-CM | POA: Diagnosis not present

## 2021-01-12 DIAGNOSIS — Z13220 Encounter for screening for lipoid disorders: Secondary | ICD-10-CM

## 2021-01-12 DIAGNOSIS — E1165 Type 2 diabetes mellitus with hyperglycemia: Secondary | ICD-10-CM | POA: Diagnosis not present

## 2021-01-12 LAB — BASIC METABOLIC PANEL
BUN: 12 mg/dL (ref 6–23)
CO2: 23 mEq/L (ref 19–32)
Calcium: 10.1 mg/dL (ref 8.4–10.5)
Chloride: 107 mEq/L (ref 96–112)
Creatinine, Ser: 0.7 mg/dL (ref 0.40–1.20)
GFR: 93.71 mL/min (ref >60.00)
Glucose, Bld: 98 mg/dL (ref 70–99)
Potassium: 3.7 mEq/L (ref 3.5–5.1)
Sodium: 138 mEq/L (ref 135–145)

## 2021-01-12 LAB — LDL CHOLESTEROL, DIRECT: Direct LDL: 104 mg/dL

## 2021-01-12 LAB — HEMOGLOBIN A1C: Hgb A1c MFr Bld: 6.2 % (ref 4.6–6.5)

## 2021-01-12 MED ORDER — JANUMET 50-500 MG PO TABS: ORAL_TABLET | ORAL | 1 refills | Status: DC

## 2021-01-12 MED ORDER — AMLODIPINE BESYLATE 10 MG PO TABS: 10.0000 mg | ORAL_TABLET | Freq: Every day | ORAL | 3 refills | Status: DC

## 2021-01-12 NOTE — Assessment & Plan Note (Signed)
Did not take BP medication this AM Has BP machine at home but does not check BP. BP Readings from Last 3 Encounters:  01/12/21 138/88  11/10/20 136/83  07/13/20 (!) 157/86   Advised to monitor BP at home every AM x 5days and send reading via mychart. Check BMP Maintain current medication dose

## 2021-01-12 NOTE — Progress Notes (Addendum)
Subjective:  Patient ID: Cindy Shepherd, female    DOB: 04-Jul-1960  Age: 60 y.o. MRN: [id number]  CC: Follow-up (6 month f/u on HTN and DM. Pt is fasting. /Declines pneumonia and shingles vaccine. )  Urinary Tract Infection  This is a recurrent problem. The current episode started more than 1 month ago. The problem occurs intermittently. The problem has been waxing and waning. The quality of the pain is described as burning. There has been no fever. She is Not sexually active. There is No history of pyelonephritis. Associated symptoms include frequency and urgency. Pertinent negatives include no chills, discharge, flank pain, hematuria, hesitancy, nausea, possible pregnancy, sweats or vomiting. She has tried antibiotics and increased fluids for the symptoms. The treatment provided moderate relief. Her past medical history is significant for urinary stasis. There is no history of catheterization, kidney stones, recurrent UTIs, a single kidney or a urological procedure.   Diabetes mellitus (HCC) No adverse effects with janumet. She is not interested in switching to oxempic injection at this time. No neuropathy Will schedule appt with ophthalmology.  Repeat HgbA1c: HgbA1c at 6.2%: controlled DM direct LDL at goal and renal function Med refill sent Advised to incorporate heart heathy diet, avoid high sugar items and daily exercise F/up in 74months  Benign hypertension Did not take BP medication this AM Has BP machine at home but does not check BP. BP Readings from Last 3 Encounters:  01/12/21 138/88  11/10/20 136/83  07/13/20 (!) 157/86   Advised to monitor BP at home every AM x 5days and send reading via mychart. Check BMP Maintain current medication dose  Depression screen [facility] 2/9 01/12/2021 03/05/2020 01/28/2018  Decreased Interest 1 1 0  Down, Depressed, Hopeless 0 1 1  PHQ - 2 Score 1 2 1   Altered sleeping 2 3 -  Tired, decreased energy 2 3 -  Change in appetite 2 3 -  Feeling  bad or failure about yourself  2 0 -  Trouble concentrating 0 0 -  Moving slowly or fidgety/restless 0 0 -  Suicidal thoughts 0 0 -  PHQ-9 Score 9 11 -  Difficult doing work/chores Not difficult at all - -    GAD 7 : Generalized Anxiety Score 01/12/2021 03/05/2020  Nervous, Anxious, on Edge 0 2  Control/stop worrying 0 2  Worry too much - different things 0 2  Trouble relaxing 1 2  Restless 0 0  Easily annoyed or irritable 1 0  Afraid - awful might happen 1 1  Total GAD 7 Score 3 9  Anxiety Difficulty Not difficult at all -     Reviewed past Medical, Social and Family history today.  Outpatient Medications Prior to Visit  Medication Sig Dispense Refill   amLODipine (NORVASC) 10 MG tablet Take 1 tablet (10 mg total) by mouth daily. 90 tablet 3   sitaGLIPtin-metformin (JANUMET) 50-500 MG tablet TAKE 1 TABLET BY MOUTH TWICE A DAY- NEEDS OFFICE VISIT 60 tablet 0   No facility-administered medications prior to visit.    ROS See HPI  Objective:  BP 138/88 (BP Location: Left Arm, Patient Position: Sitting, Cuff Size: Large)   Pulse 86   Temp (!) 97.4 F (36.3 C) (Temporal)   Ht 5' 9.75" (1.772 m)   Wt 292 lb 14.2 oz (132.9 kg)   SpO2 99%   BMI 42.33 kg/m   Physical Exam Vitals reviewed.  Constitutional:      Appearance: She is obese.  Cardiovascular:  Rate and Rhythm: Normal rate and regular rhythm.     Pulses: Normal pulses.     Heart sounds: Normal heart sounds.  Pulmonary:     Effort: Pulmonary effort is normal.     Breath sounds: Normal breath sounds.  Abdominal:     General: There is no distension.     Palpations: Abdomen is soft.     Tenderness: There is no abdominal tenderness. There is no right CVA tenderness, left CVA tenderness or guarding.  Musculoskeletal:        General: Normal range of motion.     Right lower leg: No edema.     Left lower leg: No edema.  Neurological:     Mental Status: She is alert and oriented to person, place, and time.   Psychiatric:        Mood and Affect: Mood normal.        Behavior: Behavior normal.        Thought Content: Thought content normal.    Assessment & Plan:  This visit occurred during the SARS-CoV-2 public health emergency.  Safety protocols were in place, including screening questions prior to the visit, additional usage of staff PPE, and extensive cleaning of exam room while observing appropriate contact time as indicated for disinfecting solutions.   Cindy Shepherd was seen today for follow-up.  Diagnoses and all orders for this visit:  Type 2 diabetes mellitus with hyperglycemia, without long-term current use of insulin (HCC) -     Basic metabolic panel -     Hemoglobin A1c -     Direct LDL -     sitaGLIPtin-metformin (JANUMET) 50-500 MG tablet; TAKE 1 TABLET BY MOUTH TWICE A DAY with meals  Need for influenza vaccination -     Flu Vaccine QUAD High Dose(Fluad)  Benign hypertension -     Basic metabolic panel -     Direct LDL -     amLODipine (NORVASC) 10 MG tablet; Take 1 tablet (10 mg total) by mouth daily.  Encounter for lipid screening for cardiovascular disease -     Direct LDL  Dysuria -     Urinalysis w microscopic + reflex cultur   Problem List Items Addressed This Visit       Cardiovascular and Mediastinum   Benign hypertension    Did not take BP medication this AM Has BP machine at home but does not check BP. BP Readings from Last 3 Encounters:  01/12/21 138/88  11/10/20 136/83  07/13/20 (!) 157/86   Advised to monitor BP at home every AM x 5days and send reading via mychart. Check BMP Maintain current medication dose      Relevant Medications   amLODipine (NORVASC) 10 MG tablet   Other Relevant Orders   Basic metabolic panel (Completed)   Direct LDL (Completed)     Endocrine   Diabetes mellitus (HCC) - Primary    No adverse effects with janumet. She is not interested in switching to oxempic injection at this time. No neuropathy Will schedule appt  with ophthalmology.  Repeat HgbA1c: HgbA1c at 6.2%: controlled DM direct LDL at goal and renal function Med refill sent Advised to incorporate heart heathy diet, avoid high sugar items and daily exercise F/up in 31months      Relevant Medications   sitaGLIPtin-metformin (JANUMET) 50-500 MG tablet   Other Relevant Orders   Basic metabolic panel (Completed)   Hemoglobin A1c (Completed)   Direct LDL (Completed)   Other Visit Diagnoses  Need for influenza vaccination       Relevant Orders   Flu Vaccine QUAD High Dose(Fluad) (Completed)   Encounter for lipid screening for cardiovascular disease       Relevant Orders   Direct LDL (Completed)   Dysuria       Relevant Orders   Urinalysis w microscopic + reflex cultur       Follow-up: Return in about 6 months (around 07/12/2021) for DM and weight management.  Alysia Penna, NP

## 2021-01-12 NOTE — Addendum Note (Signed)
Addended by: Alysia Penna L on: 01/12/2021 04:25 PM   Modules accepted: Orders

## 2021-01-12 NOTE — Assessment & Plan Note (Addendum)
No adverse effects with janumet. She is not interested in switching to oxempic injection at this time. No neuropathy Will schedule appt with ophthalmology.  Repeat HgbA1c: HgbA1c at 6.2%: controlled DM direct LDL at goal and renal function Med refill sent Advised to incorporate heart heathy diet, avoid high sugar items and daily exercise F/up in 84months

## 2021-01-12 NOTE — Patient Instructions (Addendum)
Sign medical release to get records from ophthalmology.  Go to lab for blood draw and urine collection  Need to maintain DASH diet and daily exercise.  Call office for podiatry referral if foot persistent despite adequate footwear.  Monitor BP at home in AM Send reading through [facility] on Monday. Maintain current medication dose  Calorie Counting for Weight Loss Calories are units of energy. Your body needs a certain number of calories from food to keep going throughout the day. When you eat or drink more calories than your body needs, your body stores the extra calories mostly as fat. When you eat or drink fewer calories than your body needs, your body burns fat to get the energy it needs. Calorie counting means keeping track of how many calories you eat and drink each day. Calorie counting can be helpful if you need to lose weight. If you eat fewer calories than your body needs, you should lose weight. Ask your health care provider what a healthy weight is for you. For calorie counting to work, you will need to eat the right number of calories each day to lose a healthy amount of weight per week. A dietitian can help you figure out how many calories you need in a day and will suggest ways to reach your calorie goal. A healthy amount of weight to lose each week is usually 1-2 lb (0.5-0.9 kg). This usually means that your daily calorie intake should be reduced by 500-750 calories. Eating 1,200-1,500 calories a day can help most women lose weight. Eating 1,500-1,800 calories a day can help most men lose weight. What do I need to know about calorie counting? Work with your health care provider or dietitian to determine how many calories you should get each day. To meet your daily calorie goal, you will need to: Find out how many calories are in each food that you would like to eat. Try to do this before you eat. Decide how much of the food you plan to eat. Keep a food log. Do this by writing  down what you ate and how many calories it had. To successfully lose weight, it is important to balance calorie counting with a healthy lifestyle that includes regular activity. Where do I find calorie information? The number of calories in a food can be found on a Nutrition Facts label. If a food does not have a Nutrition Facts label, try to look up the calories online or ask your dietitian for help. Remember that calories are listed per serving. If you choose to have more than one serving of a food, you will have to multiply the calories per serving by the number of servings you plan to eat. For example, the label on a package of bread might say that a serving size is 1 slice and that there are 90 calories in a serving. If you eat 1 slice, you will have eaten 90 calories. If you eat 2 slices, you will have eaten 180 calories. How do I keep a food log? After each time that you eat, record the following in your food log as soon as possible: What you ate. Be sure to include toppings, sauces, and other extras on the food. How much you ate. This can be measured in cups, ounces, or number of items. How many calories were in each food and drink. The total number of calories in the food you ate. Keep your food log near you, such as in a pocket-sized notebook or on  an app or website on your mobile phone. Some programs will calculate calories for you and show you how many calories you have left to meet your daily goal. What are some portion-control tips? Know how many calories are in a serving. This will help you know how many servings you can have of a certain food. Use a measuring cup to measure serving sizes. You could also try weighing out portions on a kitchen scale. With time, you will be able to estimate serving sizes for some foods. Take time to put servings of different foods on your favorite plates or in your favorite bowls and cups so you know what a serving looks like. Try not to eat straight  from a food's packaging, such as from a bag or box. Eating straight from the package makes it hard to see how much you are eating and can lead to overeating. Put the amount you would like to eat in a cup or on a plate to make sure you are eating the right portion. Use smaller plates, glasses, and bowls for smaller portions and to prevent overeating. Try not to multitask. For example, avoid watching TV or using your computer while eating. If it is time to eat, sit down at a table and enjoy your food. This will help you recognize when you are full. It will also help you be more mindful of what and how much you are eating. What are tips for following this plan? Reading food labels Check the calorie count compared with the serving size. The serving size may be smaller than what you are used to eating. Check the source of the calories. Try to choose foods that are high in protein, fiber, and vitamins, and low in saturated fat, trans fat, and sodium. Shopping Read nutrition labels while you shop. This will help you make healthy decisions about which foods to buy. Pay attention to nutrition labels for low-fat or fat-free foods. These foods sometimes have the same number of calories or more calories than the full-fat versions. They also often have added sugar, starch, or salt to make up for flavor that was removed with the fat. Make a grocery list of lower-calorie foods and stick to it. Cooking Try to cook your favorite foods in a healthier way. For example, try baking instead of frying. Use low-fat dairy products. Meal planning Use more fruits and vegetables. One-half of your plate should be fruits and vegetables. Include lean proteins, such as chicken, [place], and fish. Lifestyle Each week, aim to do one of the following: 150 minutes of moderate exercise, such as walking. 75 minutes of vigorous exercise, such as running. General information Know how many calories are in the foods you eat most often.  This will help you calculate calorie counts faster. Find a way of tracking calories that works for you. Get creative. Try different apps or programs if writing down calories does not work for you. What foods should I eat?  Eat nutritious foods. It is better to have a nutritious, high-calorie food, such as an avocado, than a food with few nutrients, such as a bag of potato chips. Use your calories on foods and drinks that will fill you up and will not leave you hungry soon after eating. Examples of foods that fill you up are nuts and nut butters, vegetables, lean proteins, and high-fiber foods such as whole grains. High-fiber foods are foods with more than 5 g of fiber per serving. Pay attention to calories in drinks. Low-calorie  drinks include water and unsweetened drinks. The items listed above may not be a complete list of foods and beverages you can eat. Contact a dietitian for more information. What foods should I limit? Limit foods or drinks that are not good sources of vitamins, minerals, or protein or that are high in unhealthy fats. These include: Candy. Other sweets. Sodas, specialty coffee drinks, alcohol, and juice. The items listed above may not be a complete list of foods and beverages you should avoid. Contact a dietitian for more information. How do I count calories when eating out? Pay attention to portions. Often, portions are much larger when eating out. Try these tips to keep portions smaller: Consider sharing a meal instead of getting your own. If you get your own meal, eat only half of it. Before you start eating, ask for a container and put half of your meal into it. When available, consider ordering smaller portions from the menu instead of full portions. Pay attention to your food and drink choices. Knowing the way food is cooked and what is included with the meal can help you eat fewer calories. If calories are listed on the menu, choose the lower-calorie  options. Choose dishes that include vegetables, fruits, whole grains, low-fat dairy products, and lean proteins. Choose items that are boiled, broiled, grilled, or steamed. Avoid items that are buttered, battered, fried, or served with cream sauce. Items labeled as crispy are usually fried, unless stated otherwise. Choose water, low-fat milk, unsweetened iced tea, or other drinks without added sugar. If you want an alcoholic beverage, choose a lower-calorie option, such as a glass of wine or light beer. Ask for dressings, sauces, and syrups on the side. These are usually high in calories, so you should limit the amount you eat. If you want a salad, choose a garden salad and ask for grilled meats. Avoid extra toppings such as bacon, cheese, or fried items. Ask for the dressing on the side, or ask for olive oil and vinegar or lemon to use as dressing. Estimate how many servings of a food you are given. Knowing serving sizes will help you be aware of how much food you are eating at restaurants. Where to find more information Centers for Disease Control and Prevention: FootballExhibition.com.br U.[place]. Department of Agriculture: WrestlingReporter.dk Summary Calorie counting means keeping track of how many calories you eat and drink each day. If you eat fewer calories than your body needs, you should lose weight. A healthy amount of weight to lose per week is usually 1-2 lb (0.5-0.9 kg). This usually means reducing your daily calorie intake by 500-750 calories. The number of calories in a food can be found on a Nutrition Facts label. If a food does not have a Nutrition Facts label, try to look up the calories online or ask your dietitian for help. Use smaller plates, glasses, and bowls for smaller portions and to prevent overeating. Use your calories on foods and drinks that will fill you up and not leave you hungry shortly after a meal. This information is not intended to replace advice given to you by your health care provider.  Make sure you discuss any questions you have with your health care provider. Document Revised: 05/29/2019 Document Reviewed: 05/29/2019 Elsevier Patient Education  2022 [employer].

## 2021-01-15 LAB — URINALYSIS W MICROSCOPIC + REFLEX CULTURE
Bilirubin Urine: NEGATIVE
Glucose, UA: NEGATIVE
Hyaline Cast: NONE SEEN /LPF
Ketones, ur: NEGATIVE
Nitrites, Initial: POSITIVE — AB
Specific Gravity, Urine: 1.021 (ref 1.001–1.035)
pH: 5.5 (ref 5.0–8.0)

## 2021-01-15 LAB — URINE CULTURE
MICRO NUMBER:: 12377904
SPECIMEN QUALITY:: ADEQUATE

## 2021-01-15 LAB — CULTURE INDICATED

## 2021-01-16 ENCOUNTER — Encounter: Payer: Self-pay | Admitting: Nurse Practitioner

## 2021-01-17 MED ORDER — SULFAMETHOXAZOLE-TRIMETHOPRIM 800-160 MG PO TABS: 1.0000 | ORAL_TABLET | Freq: Two times a day (BID) | ORAL | 0 refills | Status: DC

## 2021-01-17 NOTE — Addendum Note (Signed)
Addended by: Alysia Penna L on: 01/17/2021 03:26 PM   Modules accepted: Orders

## 2021-01-23 ENCOUNTER — Encounter: Payer: Self-pay | Admitting: Nurse Practitioner

## 2021-01-23 DIAGNOSIS — E1165 Type 2 diabetes mellitus with hyperglycemia: Secondary | ICD-10-CM

## 2021-01-24 ENCOUNTER — Other Ambulatory Visit: Payer: Self-pay | Admitting: Nurse Practitioner

## 2021-01-24 DIAGNOSIS — E1165 Type 2 diabetes mellitus with hyperglycemia: Secondary | ICD-10-CM

## 2021-01-24 MED ORDER — JANUMET 50-500 MG PO TABS: 1.0000 | ORAL_TABLET | Freq: Every day | ORAL | 1 refills | Status: DC

## 2021-02-06 DIAGNOSIS — Z1211 Encounter for screening for malignant neoplasm of colon: Secondary | ICD-10-CM

## 2021-02-19 ENCOUNTER — Encounter: Payer: Self-pay | Admitting: Nurse Practitioner

## 2021-05-05 ENCOUNTER — Other Ambulatory Visit: Payer: Self-pay | Admitting: Oral Surgery

## 2021-05-05 DIAGNOSIS — M2740 Unspecified cyst of jaw: Secondary | ICD-10-CM

## 2021-05-21 IMAGING — MG DIGITAL SCREENING BILAT W/ CAD
7 series · 7 of 7 positions shown · non-contrast
Comparison: Previous exam(s).

ACR Breast Density Category a: The breast tissue is almost entirely
fatty.

CLINICAL DATA: Screening.

EXAM:
DIGITAL SCREENING BILATERAL MAMMOGRAM WITH CAD

[L CC (1 of 2)]
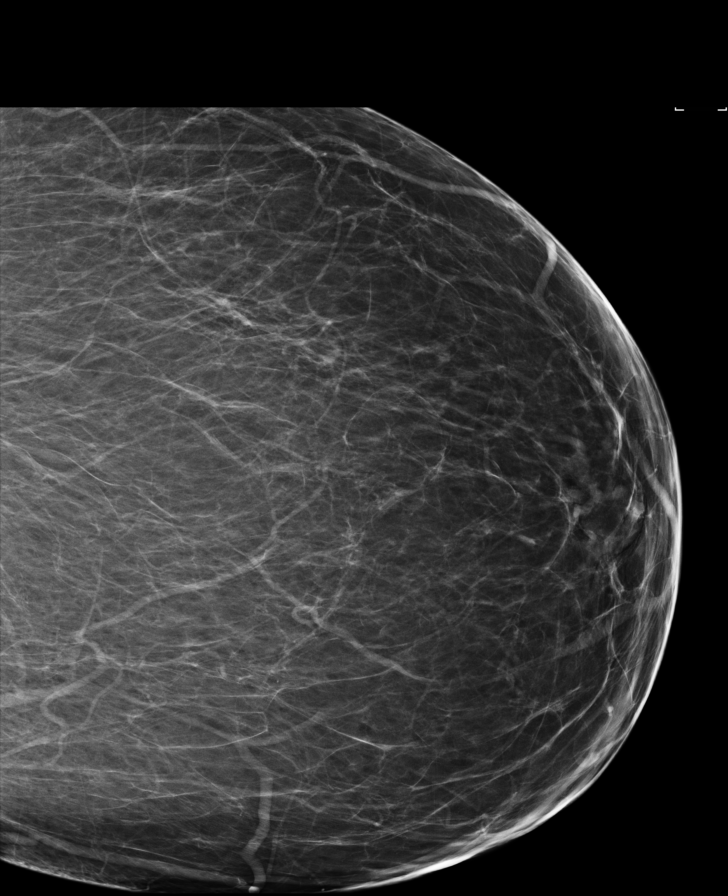

[L CC (2 of 2)]
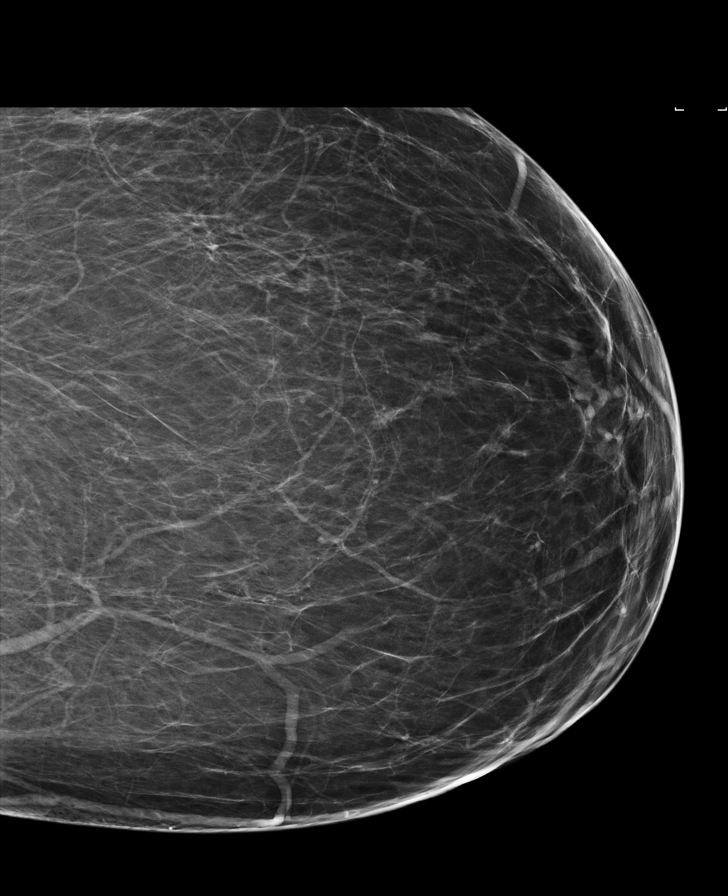

[L MLO (1 of 2)]
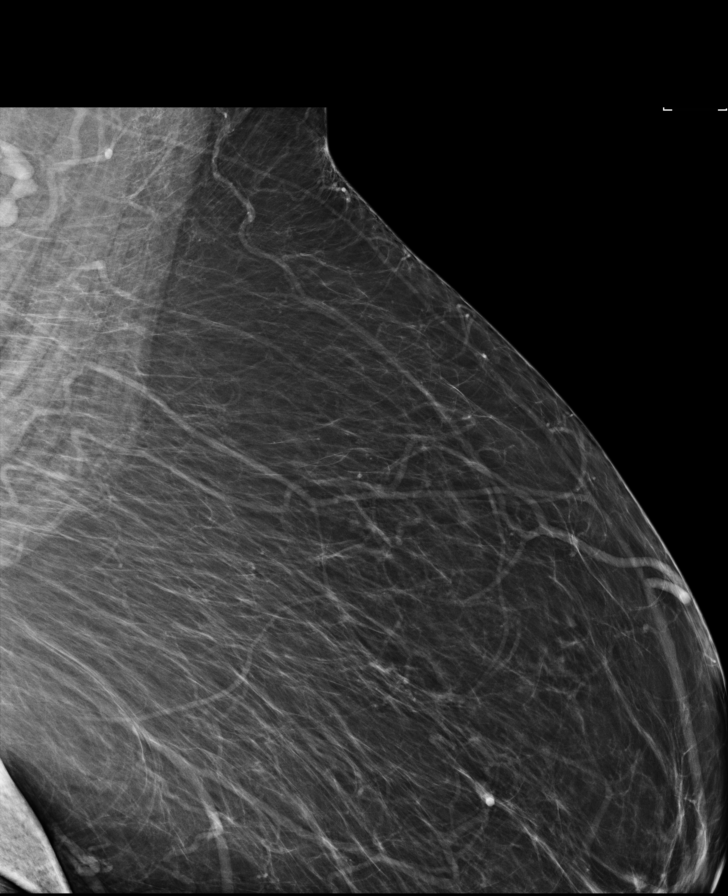

[R CC]
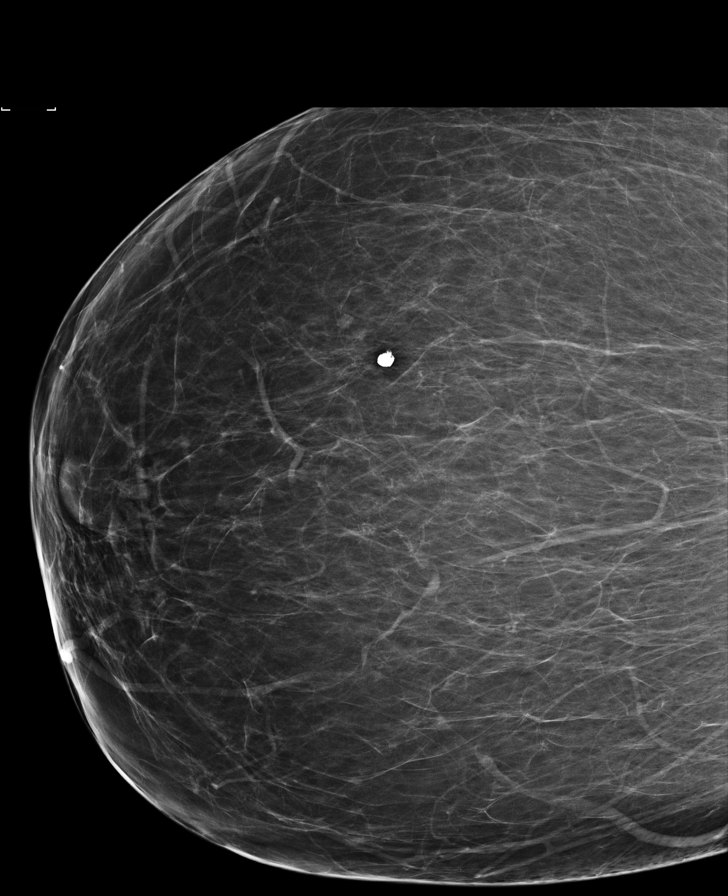

[R MLO (1 of 2)]
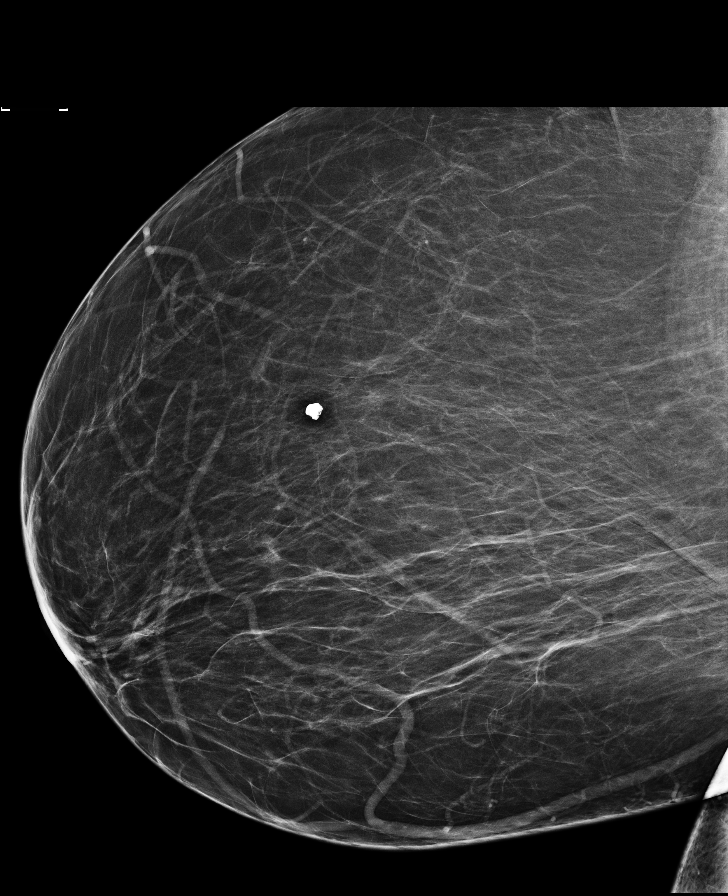

[R MLO (2 of 2)]
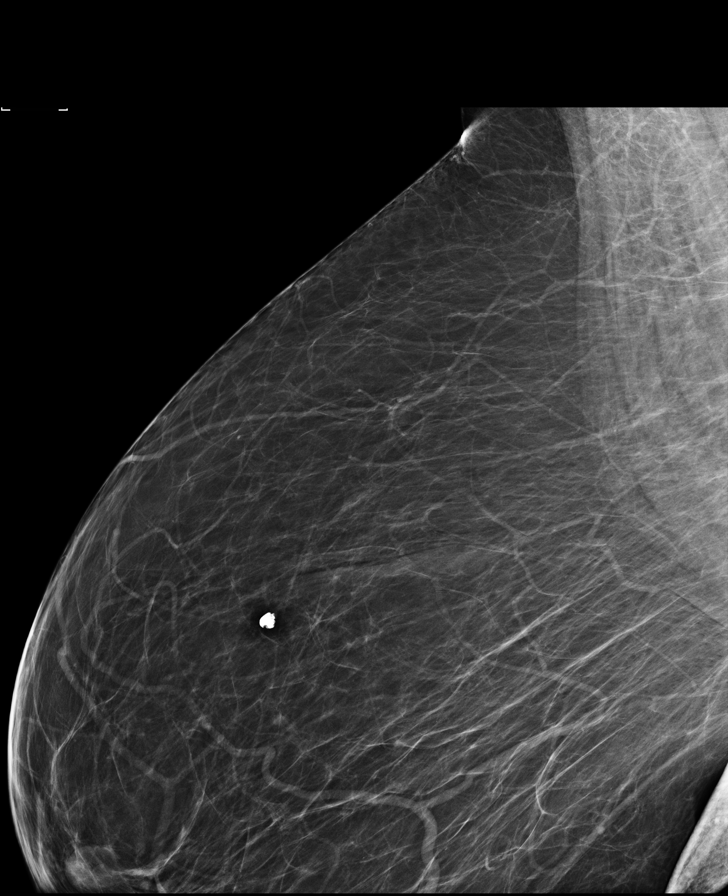

[L MLO (2 of 2)]
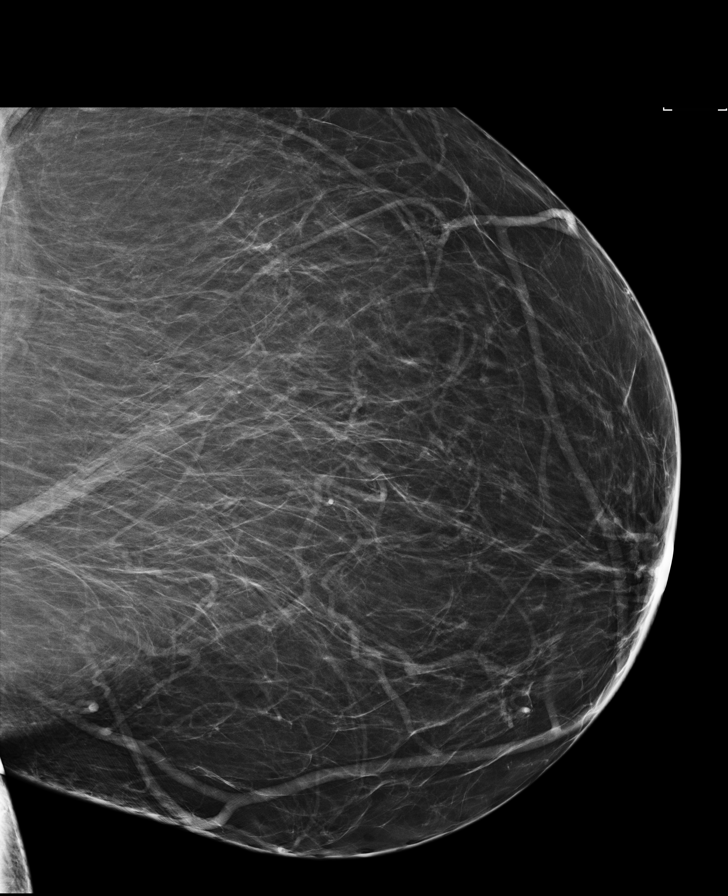

[7 of 7 positions shown; findings below may reference images not displayed]

FINDINGS: There are no findings suspicious for malignancy. Images were
processed with CAD.
IMPRESSION: No mammographic evidence of malignancy. A result letter of this
screening mammogram will be mailed directly to the patient.

RECOMMENDATION:
Screening mammogram in one year. (Code:[id number])

BI-RADS CATEGORY  1: Negative.

## 2021-06-27 ENCOUNTER — Other Ambulatory Visit: Payer: Self-pay

## 2021-06-27 DIAGNOSIS — E1165 Type 2 diabetes mellitus with hyperglycemia: Secondary | ICD-10-CM

## 2021-06-27 MED ORDER — JANUMET 50-500 MG PO TABS: 1.0000 | ORAL_TABLET | Freq: Every day | ORAL | 1 refills | Status: DC

## 2021-06-27 NOTE — Telephone Encounter (Signed)
Rx request faxed over via pt pharmacy Chart supports Rx Last seen 12/2020 Appointment scheduled for 07/14/2021

## 2021-07-14 ENCOUNTER — Ambulatory Visit: Payer: 59 | Admitting: Nurse Practitioner

## 2021-07-31 LAB — COLOGUARD: COLOGUARD: NEGATIVE

## 2021-08-03 ENCOUNTER — Ambulatory Visit: Payer: 59 | Admitting: Nurse Practitioner

## 2021-09-09 ENCOUNTER — Other Ambulatory Visit: Payer: Self-pay | Admitting: Obstetrics and Gynecology

## 2021-09-09 ENCOUNTER — Encounter: Payer: Self-pay | Admitting: Nurse Practitioner

## 2021-09-09 ENCOUNTER — Ambulatory Visit: Payer: 59 | Admitting: Nurse Practitioner

## 2021-09-09 VITALS — BP 132/84 | HR 74 | Temp 97.6°F | Wt 298.8 lb

## 2021-09-09 DIAGNOSIS — Z136 Encounter for screening for cardiovascular disorders: Secondary | ICD-10-CM

## 2021-09-09 DIAGNOSIS — Z13220 Encounter for screening for lipoid disorders: Secondary | ICD-10-CM | POA: Diagnosis not present

## 2021-09-09 DIAGNOSIS — E1165 Type 2 diabetes mellitus with hyperglycemia: Secondary | ICD-10-CM

## 2021-09-09 DIAGNOSIS — I10 Essential (primary) hypertension: Secondary | ICD-10-CM | POA: Diagnosis not present

## 2021-09-09 DIAGNOSIS — Z1231 Encounter for screening mammogram for malignant neoplasm of breast: Secondary | ICD-10-CM

## 2021-09-09 LAB — MICROALBUMIN / CREATININE URINE RATIO
Creatinine,U: 274.1 mg/dL
Microalb Creat Ratio: 0.7 mg/g (ref 0.0–30.0)
Microalb, Ur: 2 mg/dL — ABNORMAL HIGH (ref 0.0–1.9)

## 2021-09-09 LAB — COMPREHENSIVE METABOLIC PANEL
ALT: 14 U/L (ref 0–35)
AST: 15 U/L (ref 0–37)
Albumin: 3.8 g/dL (ref 3.5–5.2)
Alkaline Phosphatase: 74 U/L (ref 39–117)
BUN: 13 mg/dL (ref 6–23)
CO2: 23 mEq/L (ref 19–32)
Calcium: 10.3 mg/dL (ref 8.4–10.5)
Chloride: 108 mEq/L (ref 96–112)
Creatinine, Ser: 0.79 mg/dL (ref 0.40–1.20)
GFR: 80.67 mL/min (ref >60.00)
Glucose, Bld: 112 mg/dL — ABNORMAL HIGH (ref 70–99)
Potassium: 4 mEq/L (ref 3.5–5.1)
Sodium: 139 mEq/L (ref 135–145)
Total Bilirubin: 0.4 mg/dL (ref 0.2–1.2)
Total Protein: 7.9 g/dL (ref 6.0–8.3)

## 2021-09-09 LAB — LIPID PANEL
Cholesterol: 134 mg/dL (ref 0–200)
HDL: 35.6 mg/dL — ABNORMAL LOW (ref >39.00)
LDL Cholesterol: 87 mg/dL (ref 0–99)
NonHDL: 98.02
Total CHOL/HDL Ratio: 4
Triglycerides: 54 mg/dL (ref 0.0–149.0)
VLDL: 10.8 mg/dL (ref 0.0–40.0)

## 2021-09-09 LAB — HEMOGLOBIN A1C: Hgb A1c MFr Bld: 6.1 % (ref 4.6–6.5)

## 2021-09-09 MED ORDER — JANUMET 50-500 MG PO TABS: 1.0000 | ORAL_TABLET | Freq: Every day | ORAL | 3 refills | Status: DC

## 2021-09-09 MED ORDER — AMLODIPINE BESYLATE 10 MG PO TABS: 10.0000 mg | ORAL_TABLET | Freq: Every day | ORAL | 3 refills | Status: DC

## 2021-09-09 NOTE — Assessment & Plan Note (Signed)
BP at goal with amlodipine ?BP Readings from Last 3 Encounters:  ?09/09/21 132/84  ?01/12/21 138/88  ?11/10/20 136/83  ? ?Maintain med dose ?Refill sent ?Check CMP ?

## 2021-09-09 NOTE — Patient Instructions (Signed)
Go to lab for blood draw ?Schedule appt for PAP, eye exam and mammogram. Have results faxed to me. ?

## 2021-09-09 NOTE — Assessment & Plan Note (Addendum)
No glucose check at home. ?Last eye exam by [facility] 2022. Will schedule an appt. ?No neuropathy ?No adverse effects with janumet ? ?Repeat CMP, lipid panel, hgbA1c and urine microalbumin: stable ?Encouraged to incorporate daily exercise to improve HDL level. ?Maintain med dose ?Refill sent ?F/up in 71months ?

## 2021-09-09 NOTE — Progress Notes (Addendum)
? ?             Established Patient Visit ? ?Patient: Cindy Shepherd   DOB: 01-Jun-1960   61 y.o. Female  MRN: [id number] ?Visit Date: 09/12/2021 ? ?Subjective:  ?  ?Chief Complaint  ?Patient presents with  ? Follow-up  ?  DM. Patient is non fasting with no other concerns.   ? ?HPI ?Benign hypertension ?BP at goal with amlodipine ?BP Readings from Last 3 Encounters:  ?09/09/21 132/84  ?01/12/21 138/88  ?11/10/20 136/83  ? ?Maintain med dose ?Refill sent ?Check CMP ? ?Diabetes mellitus ([place]) ?No glucose check at home. ?Last eye exam by [facility] eye [facility] 2022. Will schedule an appt. ?No neuropathy ?No adverse effects with janumet ? ?Repeat CMP, lipid panel, hgbA1c and urine microalbumin: stable ?Encouraged to incorporate daily exercise to improve HDL level. ?Maintain med dose ?Refill sent ?F/up in 63months ? ?Reviewed medical, surgical, and social history today ? ?Medications: ?Outpatient Medications Prior to Visit  ?Medication Sig  ? [DISCONTINUED] amLODipine (NORVASC) 10 MG tablet Take 1 tablet (10 mg total) by mouth daily.  ? [DISCONTINUED] sitaGLIPtin-metformin (JANUMET) 50-500 MG tablet Take 1 tablet by mouth daily after breakfast. TAKE 1 TABLET BY MOUTH TWICE A DAY with meals  ? [DISCONTINUED] sulfamethoxazole-trimethoprim (BACTRIM DS) 800-160 MG tablet Take 1 tablet by mouth 2 (two) times daily.  ? ?No facility-administered medications prior to visit.  ? ?Reviewed past medical and social history.  ? ?ROS per HPI above ? ? ?   ?Objective:  ?BP 132/84 (BP Location: Left Arm, Patient Position: Sitting, Cuff Size: Large)   Pulse 74   Temp 97.6 ?F (36.4 ?C) (Temporal)   Wt 298 lb 12.8 oz (135.5 kg)   SpO2 98%   BMI 43.18 kg/m?  ? ?  ?BP Readings from Last 3 Encounters:  ?09/09/21 132/84  ?01/12/21 138/88  ?11/10/20 136/83  ?  ?Wt Readings from Last 3 Encounters:  ?09/09/21 298 lb 12.8 oz (135.5 kg)  ?01/12/21 292 lb 14.2 oz (132.9 kg)  ?11/10/20 285 lb 8 oz (129.5 kg)  ?  ?Physical Exam ?Constitutional:   ?    Appearance: She is obese.  ?Cardiovascular:  ?   Rate and Rhythm: Normal rate and regular rhythm.  ?   Pulses: Normal pulses.     ?     Dorsalis pedis pulses are 2+ on the right side and 2+ on the left side.  ?     Posterior tibial pulses are 2+ on the right side and 2+ on the left side.  ?   Heart sounds: Normal heart sounds.  ?Pulmonary:  ?   Effort: Pulmonary effort is normal.  ?   Breath sounds: Normal breath sounds.  ?Musculoskeletal:  ?   Right lower leg: No edema.  ?   Left lower leg: No edema.  ?   Right foot: Normal range of motion. No deformity, bunion, Charcot foot, foot drop or prominent metatarsal heads.  ?   Left foot: Normal range of motion. No deformity, bunion, Charcot foot, foot drop or prominent metatarsal heads.  ?Feet:  ?   Right foot:  ?   Protective Sensation: 7 sites tested.  7 sites sensed.  ?   Skin integrity: Skin integrity normal.  ?   Toenail Condition: Right toenails are normal.  ?   Left foot:  ?   Protective Sensation: 7 sites tested.  7 sites sensed.  ?   Skin integrity: Skin integrity normal.  ?  Toenail Condition: Left toenails are normal.  ?Skin: ?   General: Skin is warm and dry.  ?Neurological:  ?   Mental Status: She is alert and oriented to person, place, and time.  ?Psychiatric:     ?   Mood and Affect: Mood normal.     ?   Behavior: Behavior normal.     ?   Thought Content: Thought content normal.  ?  ?Results for orders placed or performed in visit on 09/09/21  ?Hemoglobin A1c  ?Result Value Ref Range  ? Hgb A1c MFr Bld 6.1 4.6 - 6.5 %  ?Microalbumin / creatinine urine ratio  ?Result Value Ref Range  ? Microalb, Ur 2.0 (H) 0.0 - 1.9 mg/dL  ? Creatinine,U 274.1 mg/dL  ? Microalb Creat Ratio 0.7 0.0 - 30.0 mg/g  ?Lipid panel  ?Result Value Ref Range  ? Cholesterol 134 0 - 200 mg/dL  ? Triglycerides 54.0 0.0 - 149.0 mg/dL  ? HDL 35.60 (L) >39.00 mg/dL  ? VLDL 10.8 0.0 - 40.0 mg/dL  ? LDL Cholesterol 87 0 - 99 mg/dL  ? Total CHOL/HDL Ratio 4   ? NonHDL 98.02   ?Comprehensive  metabolic panel  ?Result Value Ref Range  ? Sodium 139 135 - 145 mEq/L  ? Potassium 4.0 3.5 - 5.1 mEq/L  ? Chloride 108 96 - 112 mEq/L  ? CO2 23 19 - 32 mEq/L  ? Glucose, Bld 112 (H) 70 - 99 mg/dL  ? BUN 13 6 - 23 mg/dL  ? Creatinine, Ser 0.79 0.40 - 1.20 mg/dL  ? Total Bilirubin 0.4 0.2 - 1.2 mg/dL  ? Alkaline Phosphatase 74 39 - 117 U/L  ? AST 15 0 - 37 U/L  ? ALT 14 0 - 35 U/L  ? Total Protein 7.9 6.0 - 8.3 g/dL  ? Albumin 3.8 3.5 - 5.2 g/dL  ? GFR 80.67 >60.00 mL/min  ? Calcium 10.3 8.4 - 10.5 mg/dL  ? ?   ?Assessment & Plan:  ?  ?Problem List Items Addressed This Visit   ? ?  ? Cardiovascular and Mediastinum  ? Benign hypertension - Primary  ?  BP at goal with amlodipine ?BP Readings from Last 3 Encounters:  ?09/09/21 132/84  ?01/12/21 138/88  ?11/10/20 136/83  ? ?Maintain med dose ?Refill sent ?Check CMP ?  ?  ? Relevant Medications  ? amLODipine (NORVASC) 10 MG tablet  ? Other Relevant Orders  ? Comprehensive metabolic panel (Completed)  ?  ? Endocrine  ? Diabetes mellitus ([place])  ?  No glucose check at home. ?Last eye exam by [facility] eye care 2022. Will schedule an appt. ?No neuropathy ?No adverse effects with janumet ? ?Repeat CMP, lipid panel, hgbA1c and urine microalbumin: stable ?Encouraged to incorporate daily exercise to improve HDL level. ?Maintain med dose ?Refill sent ?F/up in 78months ?  ?  ? Relevant Medications  ? sitaGLIPtin-metformin (JANUMET) 50-500 MG tablet  ? Other Relevant Orders  ? Hemoglobin A1c (Completed)  ? Microalbumin / creatinine urine ratio (Completed)  ? Lipid panel (Completed)  ? Comprehensive metabolic panel (Completed)  ?  ? Other  ? Morbid obesity ([place])  ? Relevant Medications  ? sitaGLIPtin-metformin (JANUMET) 50-500 MG tablet  ? Other Relevant Orders  ? Lipid panel (Completed)  ? ?Other Visit Diagnoses   ? ? Encounter for lipid screening for cardiovascular disease      ? Relevant Orders  ? Lipid panel (Completed)  ? ?  ?Schedule appt for PAP, eye exam and  mammogram. Have results  faxed to me. ? ?Return in about 6 months (around 03/12/2022) for CPE (fasting). ? ?  ? ?Wilfred Lacy, NP ? ? ?

## 2021-10-14 ENCOUNTER — Ambulatory Visit: Payer: 59

## 2021-11-04 ENCOUNTER — Ambulatory Visit
Admission: RE | Admit: 2021-11-04 | Discharge: 2021-11-04 | Disposition: A | Discharge status: Home / Self Care | Payer: 59 | Source: Ambulatory Visit | Attending: Obstetrics and Gynecology | Admitting: Obstetrics and Gynecology

## 2021-11-04 DIAGNOSIS — Z1231 Encounter for screening mammogram for malignant neoplasm of breast: Secondary | ICD-10-CM

## 2021-12-01 ENCOUNTER — Encounter: Payer: 59 | Admitting: Obstetrics and Gynecology

## 2021-12-14 ENCOUNTER — Ambulatory Visit (INDEPENDENT_AMBULATORY_CARE_PROVIDER_SITE_OTHER): Payer: 59 | Admitting: Obstetrics and Gynecology

## 2021-12-14 ENCOUNTER — Other Ambulatory Visit (HOSPITAL_COMMUNITY)
Admission: RE | Admit: 2021-12-14 | Discharge: 2021-12-14 | Disposition: A | Discharge status: Home / Self Care | Payer: 59 | Source: Ambulatory Visit | Attending: Obstetrics and Gynecology | Admitting: Obstetrics and Gynecology

## 2021-12-14 ENCOUNTER — Encounter: Payer: Self-pay | Admitting: Obstetrics and Gynecology

## 2021-12-14 VITALS — BP 122/79 | HR 97 | Resp 16 | Ht 69.0 in | Wt 299.9 lb

## 2021-12-14 DIAGNOSIS — I10 Essential (primary) hypertension: Secondary | ICD-10-CM

## 2021-12-14 DIAGNOSIS — Z124 Encounter for screening for malignant neoplasm of cervix: Secondary | ICD-10-CM | POA: Insufficient documentation

## 2021-12-14 DIAGNOSIS — Z01419 Encounter for gynecological examination (general) (routine) without abnormal findings: Secondary | ICD-10-CM

## 2021-12-14 DIAGNOSIS — R61 Generalized hyperhidrosis: Secondary | ICD-10-CM

## 2021-12-14 DIAGNOSIS — E119 Type 2 diabetes mellitus without complications: Secondary | ICD-10-CM

## 2021-12-14 NOTE — Patient Instructions (Addendum)
Preventive Care 62-62 Years Old, Female Preventive care refers to lifestyle choices and visits with your health care provider that can promote health and wellness. Preventive care visits are also called wellness exams. What can I expect for my preventive care visit? Counseling Your health care provider may ask you questions about your: Medical history, including: Past medical problems. Family medical history. Pregnancy history. Current health, including: Menstrual cycle. Method of birth control. Emotional well-being. Home life and relationship well-being. Sexual activity and sexual health. Lifestyle, including: Alcohol, nicotine or tobacco, and drug use. Access to firearms. Diet, exercise, and sleep habits. Work and work environment. Sunscreen use. Safety issues such as seatbelt and bike helmet use. Physical exam Your health care provider will check your: Height and weight. These may be used to calculate your BMI (body mass index). BMI is a measurement that tells if you are at a healthy weight. Waist circumference. This measures the distance around your waistline. This measurement also tells if you are at a healthy weight and may help predict your risk of certain diseases, such as type 2 diabetes and high blood pressure. Heart rate and blood pressure. Body temperature. Skin for abnormal spots. What immunizations do I need?  Vaccines are usually given at various ages, according to a schedule. Your health care provider will recommend vaccines for you based on your age, medical history, and lifestyle or other factors, such as travel or where you work. What tests do I need? Screening Your health care provider may recommend screening tests for certain conditions. This may include: Lipid and cholesterol levels. Diabetes screening. This is done by checking your blood sugar (glucose) after you have not eaten for a while (fasting). Pelvic exam and Pap test. Hepatitis B test. Hepatitis C  test. HIV (human immunodeficiency virus) test. STI (sexually transmitted infection) testing, if you are at risk. Lung cancer screening. Colorectal cancer screening. Mammogram. Talk with your health care provider about when you should start having regular mammograms. This may depend on whether you have a family history of breast cancer. BRCA-related cancer screening. This may be done if you have a family history of breast, ovarian, tubal, or peritoneal cancers. Bone density scan. This is done to screen for osteoporosis. Talk with your health care provider about your test results, treatment options, and if necessary, the need for more tests. Follow these instructions at home: Eating and drinking  Eat a diet that includes fresh fruits and vegetables, whole grains, lean protein, and low-fat dairy products. Take vitamin and mineral supplements as recommended by your health care provider. Do not drink alcohol if: Your health care provider tells you not to drink. You are pregnant, may be pregnant, or are planning to become pregnant. If you drink alcohol: Limit how much you have to 0-1 drink a day. Know how much alcohol is in your drink. In the [place] one drink equals one 12 oz bottle of beer (355 mL), one 5 oz glass of wine (148 mL), or one 1 oz glass of hard liquor (44 mL). Lifestyle Brush your teeth every morning and night with fluoride toothpaste. Floss one time each day. Exercise for at least 30 minutes 5 or more days each week. Do not use any products that contain nicotine or tobacco. These products include cigarettes, chewing tobacco, and vaping devices, such as e-cigarettes. If you need help quitting, ask your health care provider. Do not use drugs. If you are sexually active, practice safe sex. Use a condom or other form of protection to   prevent STIs. If you do not wish to become pregnant, use a form of birth control. If you plan to become pregnant, see your health care provider for a  prepregnancy visit. Take aspirin only as told by your health care provider. Make sure that you understand how much to take and what form to take. Work with your health care provider to find out whether it is safe and beneficial for you to take aspirin daily. Find healthy ways to manage stress, such as: Meditation, yoga, or listening to music. Journaling. Talking to a trusted person. Spending time with friends and family. Minimize exposure to UV radiation to reduce your risk of skin cancer. Safety Always wear your seat belt while driving or riding in a vehicle. Do not drive: If you have been drinking alcohol. Do not ride with someone who has been drinking. When you are tired or distracted. While texting. If you have been using any mind-altering substances or drugs. Wear a helmet and other protective equipment during sports activities. If you have firearms in your house, make sure you follow all gun safety procedures. Seek help if you have been physically or sexually abused. What's next? Visit your health care provider once a year for an annual wellness visit. Ask your health care provider how often you should have your eyes and teeth checked. Stay up to date on all vaccines. This information is not intended to replace advice given to you by your health care provider. Make sure you discuss any questions you have with your health care provider. Document Revised: 10/13/2020 Document Reviewed: 10/13/2020 Elsevier Patient Education  [place].

## 2021-12-14 NOTE — Progress Notes (Signed)
ANNUAL PREVENTATIVE CARE GYNECOLOGY  ENCOUNTER NOTE  Subjective:       Cindy Shepherd is a 61 y.o. [phone] female here for a routine annual gynecologic exam. The patient is not sexually active. The patient has never been taking hormone replacement therapy. Patient denies post-menopausal vaginal bleeding. The patient wears seatbelts: yes. The patient participates in regular exercise: no. Has the patient ever been transfused or tattooed?: no. The patient reports that there is not domestic violence in her life.  Current complaints: 1.  Still noting excessive sweating, especially in groin and thigh region.  Has tried different powders without relief.     Gynecologic History No LMP recorded. Patient is postmenopausal. Contraception: post menopausal status Last Pap: 06/2018. Results were: abnormal - ASCUS HPV neg Last mammogram: 11/04/2021. Results were: normal Last Colonoscopy: Cologuard: 07/23/2021: Negative Last Dexa Scan: Never Done   Obstetric History OB History  Gravida Para Term Preterm AB Living  4 4 4     4   SAB IAB Ectopic Multiple Live Births          4    # Outcome Date GA Lbr Len/2nd Weight Sex Delivery Anes PTL Lv  4 Term 25    M Vag-Spont   LIV  3 Term 86    M Vag-Spont   LIV  2 Term 38    M Vag-Spont   LIV  1 Term 100    M Vag-Spont   LIV    Past Medical History:  Diagnosis Date   Diabetes mellitus without complication (HCC)    Hypertension     Family History  Problem Relation Age of Onset   Diabetes Mother    Hypertension Mother    Colon cancer Neg Hx    Colon polyps Neg Hx    Esophageal cancer Neg Hx    Rectal cancer Neg Hx    Stomach cancer Neg Hx     Past Surgical History:  Procedure Laterality Date   TUBAL LIGATION  1997    Social History   Socioeconomic History   Marital status: Married    Spouse name: Not on file   Number of children: Not on file   Years of education: Not on file   Highest education level: Not on file   Occupational History   Not on file  Tobacco Use   Smoking status: Never   Smokeless tobacco: Never  Vaping Use   Vaping Use: Never used  Substance and Sexual Activity   Alcohol use: Not Currently   Drug use: Never   Sexual activity: Not Currently    Birth control/protection: None  Other Topics Concern   Not on file  Social History Narrative   Not on file   Social Determinants of Health   Financial Resource Strain: Not on file  Food Insecurity: Not on file  Transportation Needs: Not on file  Physical Activity: Not on file  Stress: Not on file  Social Connections: Not on file  Intimate Partner Violence: Not on file    Current Outpatient Medications on File Prior to Visit  Medication Sig Dispense Refill   amLODipine (NORVASC) 10 MG tablet Take 1 tablet (10 mg total) by mouth daily. 90 tablet 3   sitaGLIPtin-metformin (JANUMET) 50-500 MG tablet Take 1 tablet by mouth daily after breakfast. 90 tablet 3   No current facility-administered medications on file prior to visit.    Allergies  Allergen Reactions   Penicillins     Has patient had a PCN  reaction causing immediate rash, facial/tongue/throat swelling, SOB or lightheadedness with hypotension: Yes Has patient had a PCN reaction causing severe rash involving mucus membranes or skin necrosis: Yes Has patient had a PCN reaction that required hospitalization: No Has patient had a PCN reaction occurring within the last 10 years: No If all of the above answers are "NO", then may proceed with Cephalosporin use.       Review of Systems ROS Review of Systems - General ROS: negative for - chills, fatigue, fever, hot flashes, night sweats, weight loss.  Positive for weight gain  Psychological ROS: negative for - anxiety, decreased libido, depression, mood swings, physical abuse or sexual abuse Ophthalmic ROS: negative for - blurry vision, eye pain or loss of vision ENT ROS: negative for - headaches, hearing change, visual  changes or vocal changes Allergy and Immunology ROS: negative for - hives, itchy/watery eyes or seasonal allergies Hematological and Lymphatic ROS: negative for - bleeding problems, bruising, swollen lymph nodes or weight loss Endocrine ROS: negative for - galactorrhea, hair pattern changes, hot flashes, malaise/lethargy, mood swings, palpitations, polydipsia/polyuria, skin changes, temperature intolerance or unexpected weight changes Breast ROS: negative for - new or changing breast lumps or nipple discharge Respiratory ROS: negative for - cough or shortness of breath Cardiovascular ROS: negative for - chest pain, irregular heartbeat, palpitations or shortness of breath Gastrointestinal ROS: no abdominal pain, change in bowel habits, or black or bloody stools Genito-Urinary ROS: no dysuria, trouble voiding, or hematuria Musculoskeletal ROS: negative for - joint pain or joint stiffness Neurological ROS: negative for - bowel and bladder control changes Dermatological ROS: negative for rash and skin lesion changes   Objective:   BP 122/79   Pulse 97   Resp 16   Ht 5\' 9"  (1.753 m)   Wt 299 lb 14.4 oz (136 kg)   BMI 44.29 kg/m  CONSTITUTIONAL: Well-developed, well-nourished female in no acute distress. Obese.  PSYCHIATRIC: Normal mood and affect. Normal behavior. Normal judgment and thought content. NEUROLGIC: Alert and oriented to person, place, and time. Normal muscle tone coordination. No cranial nerve deficit noted. HENT:  Normocephalic, atraumatic, External right and left ear normal. Oropharynx is clear and moist EYES: Conjunctivae and EOM are normal. Pupils are equal, round, and reactive to light. No scleral icterus.  NECK: Normal range of motion, supple, no masses.  Normal thyroid.  SKIN: Skin is warm and dry. No rash noted. Not diaphoretic. No erythema. No pallor. CARDIOVASCULAR: Normal heart rate noted, regular rhythm, no murmur. RESPIRATORY: Clear to auscultation bilaterally.  Effort and breath sounds normal, no problems with respiration noted. BREASTS: Symmetric in size. No masses, skin changes, nipple drainage, or lymphadenopathy. ABDOMEN: Soft, normal bowel sounds, no distention noted.  No tenderness, rebound or guarding.  BLADDER: Normal PELVIC:   Bladder no bladder distension noted             Urethra: normal appearing urethra with no masses, tenderness or lesions             Vulva: normal appearing vulva with no masses, tenderness or lesions             Vagina: mildly atrophic appearing vagina with normal discharge, no lesions             Cervix: normal appearing cervix without discharge or lesions             Uterus: uterus is normal size, shape, consistency and nontender  Adnexa: normal adnexa in size, nontender and no masses             RV: External Exam NormaI, No Rectal Masses and Normal Sphincter tone   MUSCULOSKELETAL: Normal range of motion. No tenderness.  No cyanosis, clubbing, or edema.  2+ distal pulses. LYMPHATIC: No Axillary, Supraclavicular, or Inguinal Adenopathy.   Labs: Lab Results  Component Value Date   WBC 9.1 03/08/2020   HGB 12.5 03/08/2020   HCT 39.6 03/08/2020   MCV 82.5 03/08/2020   PLT 260.0 03/08/2020    Lab Results  Component Value Date   CREATININE 0.79 09/09/2021   BUN 13 09/09/2021   NA 139 09/09/2021   K 4.0 09/09/2021   CL 108 09/09/2021   CO2 23 09/09/2021    Lab Results  Component Value Date   ALT 14 09/09/2021   AST 15 09/09/2021   ALKPHOS 74 09/09/2021   BILITOT 0.4 09/09/2021    Lab Results  Component Value Date   CHOL 134 09/09/2021   HDL 35.60 (L) 09/09/2021   LDLCALC 87 09/09/2021   LDLDIRECT 104.0 01/12/2021   TRIG 54.0 09/09/2021   CHOLHDL 4 09/09/2021    Lab Results  Component Value Date   TSH 1.52 03/08/2020    Lab Results  Component Value Date   HGBA1C 6.1 09/09/2021     Assessment:   1. Encounter for well woman exam with routine gynecological exam   2.  Cervical cancer screening   3. Type 2 diabetes mellitus without complication, without long-term current use of insulin (HCC)   4. Essential hypertension   5. Morbid obesity (HCC)   6. Hyperhidrosis      Plan:  Pap: Pap Co Test Mammogram:  UTD Colon Screening:   UTD Labs:  UTD: done by PCP Routine preventative health maintenance measures emphasized: Exercise/Diet/Weight control, Tobacco Warnings, Alcohol/Substance use risks, and Stress Management Discussed use of prescription deodorant in thigh area to control sweating. If no improvement, can refer to Dermatologist. Advised that weight can play a role in her excessive sweating.  HTN and DM managed by PCP, well controlled.  Return to Clinic - 1 Year   Hildred Laser, MD [facility]

## 2021-12-16 ENCOUNTER — Encounter: Payer: 59 | Admitting: Obstetrics and Gynecology

## 2021-12-27 LAB — CYTOLOGY - PAP
Comment: NEGATIVE
Diagnosis: UNDETERMINED — AB
High risk HPV: NEGATIVE

## 2022-01-10 ENCOUNTER — Encounter: Payer: Self-pay | Admitting: Family Medicine

## 2022-01-10 ENCOUNTER — Ambulatory Visit: Payer: 59 | Admitting: Family Medicine

## 2022-01-10 VITALS — BP 132/87 | HR 87 | Temp 97.8°F | Ht 69.0 in | Wt 299.4 lb

## 2022-01-10 DIAGNOSIS — L03011 Cellulitis of right finger: Secondary | ICD-10-CM

## 2022-01-10 MED ORDER — CEPHALEXIN 500 MG PO CAPS: 500.0000 mg | ORAL_CAPSULE | Freq: Four times a day (QID) | ORAL | 0 refills | Status: DC

## 2022-01-10 NOTE — Patient Instructions (Signed)
Soak finger in hot water for 5-10 min. three times a day.

## 2022-01-10 NOTE — Progress Notes (Signed)
[facility] [address] [place] [address] Dept: [phone] Dept Fax: [phone]  Office Visit  Subjective:    Patient ID: Cindy Shepherd, female    DOB: [birth year], 61 y.o..   MRN: [id number]  Chief Complaint  Patient presents with   Acute Visit    C/o having a splinter in finger.  Wants flu shot today.    History of Present Illness:  Patient is in today complaining of a swollen right 2nd finger. She notes she had a splinter in her finger this past Friday. She removed the splinter. She now has some redness, swelling, and soreness in this area.  Past Medical History: Patient Active Problem List   Diagnosis Date Noted   Cervical radiculopathy 07/09/2020   Benign hypertension 03/05/2020   Morbid obesity (HCC) 03/05/2020   Kidney stone 09/15/2017   Diabetes mellitus (HCC) 08/27/2017   Past Surgical History:  Procedure Laterality Date   TUBAL LIGATION  1997   Family History  Problem Relation Age of Onset   Diabetes Mother    Hypertension Mother    Colon cancer Neg Hx    Colon polyps Neg Hx    Esophageal cancer Neg Hx    Rectal cancer Neg Hx    Stomach cancer Neg Hx    Outpatient Medications Prior to Visit  Medication Sig Dispense Refill   amLODipine (NORVASC) 10 MG tablet Take 1 tablet (10 mg total) by mouth daily. 90 tablet 3   sitaGLIPtin-metformin (JANUMET) 50-500 MG tablet Take 1 tablet by mouth daily after breakfast. 90 tablet 3   No facility-administered medications prior to visit.   Allergies  Allergen Reactions   Penicillins     Has patient had a PCN reaction causing immediate rash, facial/tongue/throat swelling, SOB or lightheadedness with hypotension: Yes Has patient had a PCN reaction causing severe rash involving mucus membranes or skin necrosis: Yes Has patient had a PCN reaction that required hospitalization: No Has patient had a PCN reaction occurring within the last 10 years: No If  all of the above answers are "NO", then may proceed with Cephalosporin use.      Objective:   Today's Vitals   01/10/22 1107  BP: 132/87  Pulse: 87  Temp: 97.8 F (36.6 C)  TempSrc: Temporal  SpO2: 97%  Weight: 299 lb 6.4 oz (135.8 kg)  Height: 5\' 9"  (1.753 m)   Body mass index is 44.21 kg/m.   General: Well developed, well nourished. No acute distress. Extremities: The right 2nd finger has an area of yellowish crusting along the lateral margin of the nail. There is   a narrow zone of lighter skin and then redness more generally of the distal finger. Psych: Alert and oriented. Normal mood and affect.  Health Maintenance Due  Topic Date Due   OPHTHALMOLOGY EXAM  Never done   HIV Screening  Never done   TETANUS/TDAP  Never done   Zoster Vaccines- Shingrix (1 of 2) Never done   FOOT EXAM  07/09/2021   INFLUENZA VACCINE  11/29/2021     Assessment & Plan:   1. Paronychia of right index finger This appears to be a paronychial infection. I do not see a sizable pocket that would need lancing. I recommend she do hot soaks. I will start her on a course of antibiotics. If not improving, she should follow up.  - cephALEXin (KEFLEX) 500 MG capsule; Take 1 capsule (500 mg total) by mouth 4 (four) times daily.  Dispense: 28 capsule; Refill: 0   Return if symptoms worsen or fail to improve.   Loyola Mast, MD

## 2022-04-12 ENCOUNTER — Encounter: Payer: Self-pay | Admitting: Nurse Practitioner

## 2022-04-12 ENCOUNTER — Ambulatory Visit (INDEPENDENT_AMBULATORY_CARE_PROVIDER_SITE_OTHER): Payer: 59 | Admitting: Nurse Practitioner

## 2022-04-12 ENCOUNTER — Ambulatory Visit (INDEPENDENT_AMBULATORY_CARE_PROVIDER_SITE_OTHER): Payer: 59

## 2022-04-12 VITALS — BP 130/88 | HR 96 | Temp 96.3°F | Ht 69.0 in | Wt 297.0 lb

## 2022-04-12 DIAGNOSIS — Z0001 Encounter for general adult medical examination with abnormal findings: Secondary | ICD-10-CM | POA: Diagnosis not present

## 2022-04-12 DIAGNOSIS — E559 Vitamin D deficiency, unspecified: Secondary | ICD-10-CM

## 2022-04-12 DIAGNOSIS — Z23 Encounter for immunization: Secondary | ICD-10-CM

## 2022-04-12 DIAGNOSIS — G8929 Other chronic pain: Secondary | ICD-10-CM

## 2022-04-12 DIAGNOSIS — M5412 Radiculopathy, cervical region: Secondary | ICD-10-CM

## 2022-04-12 DIAGNOSIS — E785 Hyperlipidemia, unspecified: Secondary | ICD-10-CM

## 2022-04-12 DIAGNOSIS — I10 Essential (primary) hypertension: Secondary | ICD-10-CM | POA: Diagnosis not present

## 2022-04-12 DIAGNOSIS — M47812 Spondylosis without myelopathy or radiculopathy, cervical region: Secondary | ICD-10-CM

## 2022-04-12 DIAGNOSIS — E1169 Type 2 diabetes mellitus with other specified complication: Secondary | ICD-10-CM

## 2022-04-12 LAB — LIPID PANEL
Cholesterol: 131 mg/dL (ref 0–200)
HDL: 36.1 mg/dL — ABNORMAL LOW (ref >39.00)
LDL Cholesterol: 81 mg/dL (ref 0–99)
NonHDL: 94.6
Total CHOL/HDL Ratio: 4
Triglycerides: 68 mg/dL (ref 0.0–149.0)
VLDL: 13.6 mg/dL (ref 0.0–40.0)

## 2022-04-12 LAB — BASIC METABOLIC PANEL
BUN: 17 mg/dL (ref 6–23)
CO2: 24 mEq/L (ref 19–32)
Calcium: 10.7 mg/dL — ABNORMAL HIGH (ref 8.4–10.5)
Chloride: 107 mEq/L (ref 96–112)
Creatinine, Ser: 0.82 mg/dL (ref 0.40–1.20)
GFR: 76.83 mL/min (ref >60.00)
Glucose, Bld: 106 mg/dL — ABNORMAL HIGH (ref 70–99)
Potassium: 4.1 mEq/L (ref 3.5–5.1)
Sodium: 139 mEq/L (ref 135–145)

## 2022-04-12 LAB — VITAMIN D 25 HYDROXY (VIT D DEFICIENCY, FRACTURES): VITD: 16.98 ng/mL — ABNORMAL LOW (ref 30.00–100.00)

## 2022-04-12 LAB — CBC
HCT: 40.2 % (ref 36.0–46.0)
Hemoglobin: 12.8 g/dL (ref 12.0–15.0)
MCHC: 32 g/dL (ref 30.0–36.0)
MCV: 83.6 fl (ref 78.0–100.0)
Platelets: 274 10*3/uL (ref 150.0–400.0)
RBC: 4.8 Mil/uL (ref 3.87–5.11)
RDW: 15.3 % (ref 11.5–15.5)
WBC: 8.3 10*3/uL (ref 4.0–10.5)

## 2022-04-12 LAB — TSH: TSH: 1.68 u[IU]/mL (ref 0.35–5.50)

## 2022-04-12 LAB — HEMOGLOBIN A1C: Hgb A1c MFr Bld: 6.4 % (ref 4.6–6.5)

## 2022-04-12 MED ORDER — MELOXICAM 7.5 MG PO TABS: 7.5000 mg | ORAL_TABLET | Freq: Every day | ORAL | 0 refills | Status: DC

## 2022-04-12 MED ORDER — CYCLOBENZAPRINE HCL 5 MG PO TABS: 5.0000 mg | ORAL_TABLET | Freq: Every day | ORAL | 0 refills | Status: DC

## 2022-04-12 NOTE — Patient Instructions (Signed)
Go to lab You will be contacted to schedule appt for diabetic eye exam Start heart healthy diet and daily exercise (walking or home video-low impact 3x/week, 20-59mins each day) Go to [address] for neck x-ray, 1st floor. Start mobic and flexeril for neck pain  Neck Exercises Ask your health care provider which exercises are safe for you. Do exercises exactly as told by your health care provider and adjust them as directed. It is normal to feel mild stretching, pulling, tightness, or discomfort as you do these exercises. Stop right away if you feel sudden pain or your pain gets worse. Do not begin these exercises until told by your health care provider. Neck exercises can be important for many reasons. They can improve strength and maintain flexibility in your neck, which will help your upper back and prevent neck pain. Stretching exercises Rotation neck stretching  Sit in a chair or stand up. Place your feet flat on the floor, shoulder-width apart. Slowly turn your head (rotate) to the right until a slight stretch is felt. Turn it all the way to the right so you can look over your right shoulder. Do not tilt or tip your head. Hold this position for 10-30 seconds. Slowly turn your head (rotate) to the left until a slight stretch is felt. Turn it all the way to the left so you can look over your left shoulder. Do not tilt or tip your head. Hold this position for 10-30 seconds. Repeat __________ times. Complete this exercise __________ times a day. Neck retraction  Sit in a sturdy chair or stand up. Look straight ahead. Do not bend your neck. Use your fingers to push your chin backward (retraction). Do not bend your neck for this movement. Continue to face straight ahead. If you are doing the exercise properly, you will feel a slight sensation in your throat and a stretch at the back of your neck. Hold the stretch for 1-2 seconds. Repeat __________ times. Complete this exercise  __________ times a day. Strengthening exercises Neck press  Lie on your back on a firm bed or on the floor with a pillow under your head. Use your neck muscles to push your head down on the pillow and straighten your spine. Hold the position as well as you can. Keep your head facing up (in a neutral position) and your chin tucked. Slowly count to 5 while holding this position. Repeat __________ times. Complete this exercise __________ times a day. Isometrics These are exercises in which you strengthen the muscles in your neck while keeping your neck still (isometrics). Sit in a supportive chair and place your hand on your forehead. Keep your head and face facing straight ahead. Do not flex or extend your neck while doing isometrics. Push forward with your head and neck while pushing back with your hand. Hold for 10 seconds. Do the sequence again, this time putting your hand against the back of your head. Use your head and neck to push backward against the hand pressure. Finally, do the same exercise on either side of your head, pushing sideways against the pressure of your hand. Repeat __________ times. Complete this exercise __________ times a day. Prone head lifts  Lie face-down (prone position), resting on your elbows so that your chest and upper back are raised. Start with your head facing downward, near your chest. Position your chin either on or near your chest. Slowly lift your head upward. Lift until you are looking straight ahead. Then continue  lifting your head as far back as you can comfortably stretch. Hold your head up for 5 seconds. Then slowly lower it to your starting position. Repeat __________ times. Complete this exercise __________ times a day. Supine head lifts  Lie on your back (supine position), bending your knees to point to the ceiling and keeping your feet flat on the floor. Lift your head slowly off the floor, raising your chin toward your chest. Hold for 5  seconds. Repeat __________ times. Complete this exercise __________ times a day. Scapular retraction  Stand with your arms at your sides. Look straight ahead. Slowly pull both shoulders (scapulae) backward and downward (retraction) until you feel a stretch between your shoulder blades in your upper back. Hold for 10-30 seconds. Relax and repeat. Repeat __________ times. Complete this exercise __________ times a day. Contact a health care provider if: Your neck pain or discomfort gets worse when you do an exercise. Your neck pain or discomfort does not improve within 2 hours after you exercise. If you have any of these problems, stop exercising right away. Do not do the exercises again unless your health care provider says that you can. Get help right away if: You develop sudden, severe neck pain. If this happens, stop exercising right away. Do not do the exercises again unless your health care provider says that you can. This information is not intended to replace advice given to you by your health care provider. Make sure you discuss any questions you have with your health care provider. Document Revised: 10/12/2020 Document Reviewed: 10/12/2020 Elsevier Patient Education  2023 [employer].

## 2022-04-12 NOTE — Assessment & Plan Note (Signed)
No glucose check at home. No adverse effects with janumet Bp at goal Normal foot exam and urine microalbumin Agreed to ref to ophthalmology Repeat hgb A1c, bmp and lipid panel Maintain janumet dose F/up in 40months

## 2022-04-12 NOTE — Assessment & Plan Note (Signed)
Continues to struggle with implementing heart healthy diet and daily exercise. Reports joint pain and fatigue due to weight gain. Wt Readings from Last 3 Encounters:  04/12/22 297 lb (134.7 kg)  01/10/22 299 lb 6.4 oz (135.8 kg)  12/14/21 299 lb 14.4 oz (136 kg)    We discussed use of GLP-1 injection. She declined med at this time.

## 2022-04-12 NOTE — Assessment & Plan Note (Addendum)
Chronic, waxing and waning Associated with Intermittent neck pain and numbness in hands. Worse in supine position. Current use of heat and biofreeze with some improvement.  Ordered cervical spine DG, mobic and flexeril. Consider referral to PT if no improvement in 4weeks

## 2022-04-12 NOTE — Progress Notes (Signed)
Complete physical exam  Patient: Cindy Shepherd   DOB: 1960/10/28   61 y.o. Female  MRN: [id number] Visit Date: 04/12/2022  Subjective:    Chief Complaint  Patient presents with   Annual Exam    CPE Pt fasting  Neuropathy concerns  Tdap given today    Cindy Shepherd is a 61 y.o. female who presents today for a complete physical exam. She reports consuming a general diet.  No exercise regimen  She generally feels fairly well. She reports sleeping fairly well. She does have additional problems to discuss today.  Vision:No Dental:No STD Screen:No  Wt Readings from Last 3 Encounters:  04/12/22 297 lb (134.7 kg)  01/10/22 299 lb 6.4 oz (135.8 kg)  12/14/21 299 lb 14.4 oz (136 kg)    BP Readings from Last 3 Encounters:  04/12/22 130/88  01/10/22 132/87  12/14/21 122/79    Most recent fall risk assessment:    12/14/2021    8:13 AM  Fall Risk   Falls in the past year? 0  Number falls in past yr: 0  Injury with Fall? 0  Risk for fall due to : No Fall Risks  Follow up Falls evaluation completed   Depression screen:Yes - Depression  Most recent depression screenings:    04/12/2022   10:07 AM 12/14/2021    8:14 AM  PHQ 2/9 Scores  PHQ - 2 Score 3 3  PHQ- 9 Score 12 11   HPI  Benign hypertension BP at goal with amlodipine BP Readings from Last 3 Encounters:  04/12/22 130/88  01/10/22 132/87  12/14/21 122/79    Maintain med dose  Morbid obesity (HCC) Continues to struggle with implementing heart healthy diet and daily exercise. Reports joint pain and fatigue due to weight gain. Wt Readings from Last 3 Encounters:  04/12/22 297 lb (134.7 kg)  01/10/22 299 lb 6.4 oz (135.8 kg)  12/14/21 299 lb 14.4 oz (136 kg)    We discussed use of GLP-1 injection. She declined med at this time.  Cervical radiculopathy Chronic, waxing and waning Associated with Intermittent neck pain and numbness in hands. Worse in supine position. Current use of heat and biofreeze with  some improvement.  Ordered cervical spine DG, mobic and flexeril. Consider referral to PT if no improvement in 4weeks   Diabetes mellitus (HCC) No glucose check at home. No adverse effects with janumet Bp at goal Normal foot exam and urine microalbumin Agreed to ref to ophthalmology Repeat hgb A1c, bmp and lipid panel Maintain janumet dose F/up in 3months   Past Medical History:  Diagnosis Date   Diabetes mellitus without complication (HCC)    Hypertension    Past Surgical History:  Procedure Laterality Date   TUBAL LIGATION  1997   Social History   Socioeconomic History   Marital status: Married    Spouse name: Not on file   Number of children: Not on file   Years of education: Not on file   Highest education level: Not on file  Occupational History   Not on file  Tobacco Use   Smoking status: Never   Smokeless tobacco: Never  Vaping Use   Vaping Use: Never used  Substance and Sexual Activity   Alcohol use: Not Currently   Drug use: Never   Sexual activity: Not Currently    Birth control/protection: None  Other Topics Concern   Not on file  Social History Narrative   Not on file   Social Determinants of Health  Financial Resource Strain: Not on file  Food Insecurity: Not on file  Transportation Needs: Not on file  Physical Activity: Not on file  Stress: Not on file  Social Connections: Not on file  Intimate Partner Violence: Not on file   Family Status  Relation Name Status   Mother  Alive   Father  Deceased   Neg Hx  (Not Specified)   Family History  Problem Relation Age of Onset   Diabetes Mother    Hypertension Mother    Colon cancer Neg Hx    Colon polyps Neg Hx    Esophageal cancer Neg Hx    Rectal cancer Neg Hx    Stomach cancer Neg Hx    Allergies  Allergen Reactions   Penicillins     Has patient had a PCN reaction causing immediate rash, facial/tongue/throat swelling, SOB or lightheadedness with hypotension: Yes Has patient  had a PCN reaction causing severe rash involving mucus membranes or skin necrosis: Yes Has patient had a PCN reaction that required hospitalization: No Has patient had a PCN reaction occurring within the last 10 years: No If all of the above answers are "NO", then may proceed with Cephalosporin use.     Patient Care Team: Latroya Ng, Bonna Gains, NP as PCP - General (Internal Medicine)   Medications: Outpatient Medications Prior to Visit  Medication Sig   amLODipine (NORVASC) 10 MG tablet Take 1 tablet (10 mg total) by mouth daily.   sitaGLIPtin-metformin (JANUMET) 50-500 MG tablet Take 1 tablet by mouth daily after breakfast.   [DISCONTINUED] cephALEXin (KEFLEX) 500 MG capsule Take 1 capsule (500 mg total) by mouth 4 (four) times daily. (Patient not taking: Reported on 04/12/2022)   No facility-administered medications prior to visit.    Review of Systems  Constitutional:  Negative for fever.  HENT:  Negative for congestion and sore throat.   Eyes:        Negative for visual changes  Respiratory:  Negative for cough and shortness of breath.   Cardiovascular:  Negative for chest pain, palpitations and leg swelling.  Gastrointestinal:  Negative for blood in stool, constipation and diarrhea.  Genitourinary:  Negative for dysuria, frequency and urgency.  Musculoskeletal:  Positive for neck pain and neck stiffness. Negative for myalgias.  Skin:  Negative for rash.  Neurological:  Positive for weakness and numbness. Negative for dizziness and headaches.       Paresthesia and weaknees in hands  Hematological:  Does not bruise/bleed easily.  Psychiatric/Behavioral:  Negative for suicidal ideas. The patient is not nervous/anxious.     Last metabolic panel Lab Results  Component Value Date   GLUCOSE 112 (H) 09/09/2021   NA 139 09/09/2021   K 4.0 09/09/2021   CL 108 09/09/2021   CO2 23 09/09/2021   BUN 13 09/09/2021   CREATININE 0.79 09/09/2021   GFRNONAA >60 09/13/2017   CALCIUM  10.3 09/09/2021   PROT 7.9 09/09/2021   ALBUMIN 3.8 09/09/2021   BILITOT 0.4 09/09/2021   ALKPHOS 74 09/09/2021   AST 15 09/09/2021   ALT 14 09/09/2021   ANIONGAP 12 09/13/2017   Last lipids Lab Results  Component Value Date   CHOL 134 09/09/2021   HDL 35.60 (L) 09/09/2021   LDLCALC 87 09/09/2021   LDLDIRECT 104.0 01/12/2021   TRIG 54.0 09/09/2021   CHOLHDL 4 09/09/2021   Last hemoglobin A1c Lab Results  Component Value Date   HGBA1C 6.1 09/09/2021   Last thyroid functions Lab Results  Component Value  Date   TSH 1.52 03/08/2020        Objective:  BP 130/88   Pulse 96   Temp (!) 96.3 F (35.7 C) (Temporal)   Ht 5\' 9"  (1.753 m)   Wt 297 lb (134.7 kg)   SpO2 97%   BMI 43.86 kg/m     Physical Exam Vitals reviewed.  Constitutional:      General: She is not in acute distress.    Appearance: She is well-developed.  HENT:     Right Ear: Tympanic membrane, ear canal and external ear normal.     Left Ear: Tympanic membrane, ear canal and external ear normal.     Nose: Nose normal.  Eyes:     Extraocular Movements: Extraocular movements intact.     Conjunctiva/sclera: Conjunctivae normal.     Pupils: Pupils are equal, round, and reactive to light.  Cardiovascular:     Rate and Rhythm: Normal rate and regular rhythm.     Pulses: Normal pulses.          Dorsalis pedis pulses are 2+ on the right side and 2+ on the left side.       Posterior tibial pulses are 2+ on the right side and 2+ on the left side.     Heart sounds: Normal heart sounds.  Pulmonary:     Effort: Pulmonary effort is normal. No respiratory distress.     Breath sounds: Normal breath sounds.  Chest:     Chest wall: No tenderness.  Abdominal:     General: Bowel sounds are normal.     Palpations: Abdomen is soft.  Genitourinary:    Comments: Deferred breast and pelvic exam to GYN Musculoskeletal:        General: Tenderness present. No signs of injury. Normal range of motion.     Right  shoulder: Normal.     Left shoulder: Normal.     Right upper arm: Normal.     Left upper arm: Normal.     Right elbow: Normal.     Left elbow: Normal.     Right forearm: Normal.     Left forearm: Normal.     Right wrist: Normal.     Left wrist: Normal.     Right hand: Normal.     Left hand: Normal.     Cervical back: Normal range of motion and neck supple. Spasms and tenderness present. No rigidity, torticollis, bony tenderness or crepitus. Pain with movement present. Normal range of motion.     Thoracic back: Normal.     Right lower leg: No edema.     Left lower leg: No edema.     Right foot: Normal range of motion. No deformity, bunion or prominent metatarsal heads.     Left foot: Normal range of motion. No deformity, bunion or prominent metatarsal heads.  Feet:     Right foot:     Protective Sensation: 8 sites tested.  8 sites sensed.     Skin integrity: Skin integrity normal.     Toenail Condition: Right toenails are normal.     Left foot:     Protective Sensation: 8 sites tested.  8 sites sensed.     Skin integrity: Skin integrity normal.     Toenail Condition: Left toenails are normal.  Lymphadenopathy:     Cervical: No cervical adenopathy.  Skin:    General: Skin is warm and dry.     Findings: No lesion.  Neurological:     Mental  Status: She is alert and oriented to person, place, and time.     Cranial Nerves: No cranial nerve deficit.     Motor: No weakness.     Coordination: Coordination normal.     Gait: Gait normal.     Deep Tendon Reflexes: Reflexes are normal and symmetric.  Psychiatric:        Mood and Affect: Mood normal.        Behavior: Behavior normal.        Thought Content: Thought content normal.      No results found for any visits on 04/12/22.    Assessment & Plan:    Routine Health Maintenance and Physical Exam  Immunization History  Administered Date(s) Administered   Fluad Quad(high Dose 65+) 01/12/2021, 04/12/2022   Influenza,  Quadrivalent, Recombinant, Inj, Pf 05/10/2018   Influenza,inj,Quad PF,6+ Mos 05/10/2018    Health Maintenance  Topic Date Due   OPHTHALMOLOGY EXAM  Never done   DTaP/Tdap/Td (1 - Tdap) Never done   HEMOGLOBIN A1C  03/12/2022   COVID-19 Vaccine (1) 04/28/2022 (Originally 08/29/1961)   Zoster Vaccines- Shingrix (1 of 2) 07/12/2022 (Originally 03/01/2010)   HIV Screening  04/13/2023 (Originally 03/02/1975)   Diabetic kidney evaluation - eGFR measurement  09/10/2022   Diabetic kidney evaluation - Urine ACR  09/10/2022   FOOT EXAM  04/13/2023   MAMMOGRAM  11/05/2023   Fecal DNA (Cologuard)  07/23/2024   PAP SMEAR-Modifier  12/14/2024   INFLUENZA VACCINE  Completed   Hepatitis C Screening  Completed   HPV VACCINES  Aged Out   Discussed health benefits of physical activity, and encouraged her to engage in regular exercise appropriate for her age and condition.  Problem List Items Addressed This Visit       Cardiovascular and Mediastinum   Benign hypertension    BP at goal with amlodipine BP Readings from Last 3 Encounters:  04/12/22 130/88  01/10/22 132/87  12/14/21 122/79    Maintain med dose        Endocrine   Diabetes mellitus (HCC)    No glucose check at home. No adverse effects with janumet Bp at goal Normal foot exam and urine microalbumin Agreed to ref to ophthalmology Repeat hgb A1c, bmp and lipid panel Maintain janumet dose F/up in 3months      Relevant Orders   Ambulatory referral to Ophthalmology   Hemoglobin A1c     Nervous and Auditory   Cervical radiculopathy    Chronic, waxing and waning Associated with Intermittent neck pain and numbness in hands. Worse in supine position. Current use of heat and biofreeze with some improvement.  Ordered cervical spine DG, mobic and flexeril. Consider referral to PT if no improvement in 4weeks       Relevant Medications   meloxicam (MOBIC) 7.5 MG tablet   cyclobenzaprine (FLEXERIL) 5 MG tablet   Other Relevant  Orders   DG Cervical Spine Complete     Other   Morbid obesity (HCC)    Continues to struggle with implementing heart healthy diet and daily exercise. Reports joint pain and fatigue due to weight gain. Wt Readings from Last 3 Encounters:  04/12/22 297 lb (134.7 kg)  01/10/22 299 lb 6.4 oz (135.8 kg)  12/14/21 299 lb 14.4 oz (136 kg)    We discussed use of GLP-1 injection. She declined med at this time.      Other Visit Diagnoses     Encounter for preventative adult health care exam with abnormal findings    -  Primary   Relevant Orders   CBC   Basic metabolic panel   TSH   Vitamin D deficiency       Relevant Orders   VITAMIN D 25 Hydroxy (Vit-D Deficiency, Fractures)   Hyperlipidemia associated with type 2 diabetes mellitus (HCC)       Relevant Orders   Lipid panel   Need for immunization against influenza       Relevant Orders   Flu Vaccine QUAD High Dose(Fluad) (Completed)      Return in about 3 months (around 07/12/2022) for DM, HTN, hyperlipidemia (fasting), Weight management.     Alysia Penna, NP

## 2022-04-12 NOTE — Assessment & Plan Note (Signed)
BP at goal with amlodipine BP Readings from Last 3 Encounters:  04/12/22 130/88  01/10/22 132/87  12/14/21 122/79    Maintain med dose

## 2022-04-14 MED ORDER — VITAMIN D (ERGOCALCIFEROL) 1.25 MG (50000 UNIT) PO CAPS: 50000.0000 [IU] | ORAL_CAPSULE | ORAL | 0 refills | Status: DC

## 2022-04-14 NOTE — Addendum Note (Signed)
Addended by: Alysia Penna L on: 04/14/2022 01:57 PM   Modules accepted: Orders

## 2022-04-14 NOTE — Progress Notes (Unsigned)
See result note.  

## 2022-04-26 ENCOUNTER — Encounter: Payer: Self-pay | Admitting: Nurse Practitioner

## 2022-05-23 LAB — HM DIABETES EYE EXAM

## 2022-06-20 ENCOUNTER — Encounter: Payer: Self-pay | Admitting: Family Medicine

## 2022-06-20 ENCOUNTER — Ambulatory Visit (INDEPENDENT_AMBULATORY_CARE_PROVIDER_SITE_OTHER): Payer: 59 | Admitting: Family Medicine

## 2022-06-20 VITALS — BP 118/78 | HR 86 | Temp 97.0°F | Ht 69.0 in | Wt 296.2 lb

## 2022-06-20 DIAGNOSIS — R300 Dysuria: Secondary | ICD-10-CM

## 2022-06-20 DIAGNOSIS — R319 Hematuria, unspecified: Secondary | ICD-10-CM | POA: Diagnosis not present

## 2022-06-20 LAB — POCT URINALYSIS DIPSTICK
Bilirubin, UA: NEGATIVE
Glucose, UA: NEGATIVE
Ketones, UA: NEGATIVE
Leukocytes, UA: NEGATIVE
Nitrite, UA: NEGATIVE
Protein, UA: POSITIVE — AB
Spec Grav, UA: >=1.03 — AB (ref 1.010–1.025)
Urobilinogen, UA: 0.2 E.U./dL
pH, UA: 5.5 (ref 5.0–8.0)

## 2022-06-20 MED ORDER — PHENAZOPYRIDINE HCL 100 MG PO TABS: 100.0000 mg | ORAL_TABLET | Freq: Three times a day (TID) | ORAL | 0 refills | Status: DC | PRN

## 2022-06-20 NOTE — Progress Notes (Signed)
[facility] [address] [place] [address] Dept: [phone] Dept Fax: [phone]  Office Visit  Subjective:    Patient ID: Cindy Shepherd, female    DOB: [birth year], 62 y.o..   MRN: [id number]  Chief Complaint  Patient presents with   Dysuria    C/o having pain with urination/ frequency x 2 weeks.     History of Present Illness:  Patient is in today concerned for a possible UTI. MS. Fatima Sanger notes she started having symptoms 2 weeks ago. She was seen for a virtual visit. She was treated with a 10-day course of an antibiotic. She notes her symptoms really have never improved. She is having urinary frequency and dysuria. She has some itching, but no vaginal discharge. She has been seeing some blood tinge to her urine in the past few days. She has had a prior UTI, but the last was 2 years ago.   Past Medical History: Patient Active Problem List   Diagnosis Date Noted   Cervical radiculopathy 07/09/2020   Benign hypertension 03/05/2020   Morbid obesity ([place]) 03/05/2020   Kidney stone 09/15/2017   Diabetes mellitus ([place]) 08/27/2017   Past Surgical History:  Procedure Laterality Date   TUBAL LIGATION  1997   Family History  Problem Relation Age of Onset   Diabetes Mother    Hypertension Mother    Colon cancer Neg Hx    Colon polyps Neg Hx    Esophageal cancer Neg Hx    Rectal cancer Neg Hx    Stomach cancer Neg Hx    Outpatient Medications Prior to Visit  Medication Sig Dispense Refill   amLODipine (NORVASC) 10 MG tablet Take 1 tablet (10 mg total) by mouth daily. 90 tablet 3   sitaGLIPtin-metformin (JANUMET) 50-500 MG tablet Take 1 tablet by mouth daily after breakfast. 90 tablet 3   Vitamin D, Ergocalciferol, (DRISDOL) 1.25 MG (50000 UNIT) CAPS capsule Take 1 capsule (50,000 Units total) by mouth every 7 (seven) days. 12 capsule 0   cyclobenzaprine (FLEXERIL) 5 MG tablet Take 1 tablet (5 mg total) by mouth at  bedtime. (Patient not taking: Reported on 06/20/2022) 14 tablet 0   meloxicam (MOBIC) 7.5 MG tablet Take 1 tablet (7.5 mg total) by mouth daily. With food (Patient not taking: Reported on 06/20/2022) 30 tablet 0   No facility-administered medications prior to visit.   Allergies  Allergen Reactions   Penicillins     Has patient had a PCN reaction causing immediate rash, facial/tongue/throat swelling, SOB or lightheadedness with hypotension: Yes Has patient had a PCN reaction causing severe rash involving mucus membranes or skin necrosis: Yes Has patient had a PCN reaction that required hospitalization: No Has patient had a PCN reaction occurring within the last 10 years: No If all of the above answers are "NO", then may proceed with Cephalosporin use.      Objective:   Today's Vitals   06/20/22 1457  BP: 118/78  Pulse: 86  Temp: (!) 97 F (36.1 C)  TempSrc: Temporal  SpO2: 98%  Weight: 296 lb 3.2 oz (134.4 kg)  Height: 5' 9"$  (1.753 m)   Body mass index is 43.74 kg/m.   General: Well developed, well nourished. No acute distress. Psych: Alert and oriented. Normal mood and affect.  Health Maintenance Due  Topic Date Due   OPHTHALMOLOGY EXAM  Never done   DTaP/Tdap/Td (1 - Tdap) Never done   Lab Results     Component Ref  Range & Units 15:11 (06/20/22)  Color, UA  yellow  Clarity, UA  clear  Glucose, UA Negative Negative  Bilirubin, UA  neg  Ketones, UA  neg  Spec Grav, UA 1.010 - 1.025 >=1.030 Abnormal   Blood, UA  1+  pH, UA 5.0 - 8.0 5.5  Protein, UA Negative Positive Abnormal   Comment: 15 mg/dl  Urobilinogen, UA 0.2 or 1.0 E.U./dL 0.2  Nitrite, UA  neg  Leukocytes, UA Negative Negative     Assessment & Plan:  1. Dysuria 2. Hematuria, unspecified type  This may be a urine infection, but leukocyte esterase was negative. I recommend we culture the urine and determine if antibiotics are needed based on the results of this. The urinary urgency and dysuria may be  due to GUSM. We discussed that if her urine culture is negative, we would consider a trial of estrogen vaginal cream. In the meantime, I will give her some Pyridium for symptom control.  - POCT Urinalysis Dipstick - Urine Culture - Urinalysis, Routine w reflex microscopic - phenazopyridine (PYRIDIUM) 100 MG tablet; Take 1 tablet (100 mg total) by mouth 3 (three) times daily as needed for pain.  Dispense: 10 tablet; Refill: 0  Return if symptoms worsen or fail to improve.   Haydee Salter, MD

## 2022-06-21 LAB — URINALYSIS, ROUTINE W REFLEX MICROSCOPIC
Bilirubin Urine: NEGATIVE
Nitrite: NEGATIVE
Specific Gravity, Urine: >=1.03 — AB (ref 1.000–1.030)
Total Protein, Urine: NEGATIVE
Urine Glucose: NEGATIVE
Urobilinogen, UA: 0.2 (ref 0.0–1.0)
pH: 5.5 (ref 5.0–8.0)

## 2022-06-23 LAB — URINE CULTURE
MICRO NUMBER:: 14589476
SPECIMEN QUALITY:: ADEQUATE

## 2022-06-23 MED ORDER — SULFAMETHOXAZOLE-TRIMETHOPRIM 800-160 MG PO TABS: 1.0000 | ORAL_TABLET | Freq: Two times a day (BID) | ORAL | 0 refills | Status: DC

## 2022-06-23 NOTE — Addendum Note (Signed)
Addended by: Haydee Salter on: 06/23/2022 12:58 PM   Modules accepted: Orders

## 2022-07-14 ENCOUNTER — Ambulatory Visit: Payer: 59 | Admitting: Nurse Practitioner

## 2022-09-11 ENCOUNTER — Other Ambulatory Visit: Payer: Self-pay | Admitting: Nurse Practitioner

## 2022-09-11 DIAGNOSIS — I10 Essential (primary) hypertension: Secondary | ICD-10-CM

## 2022-10-04 ENCOUNTER — Other Ambulatory Visit: Payer: Self-pay | Admitting: Nurse Practitioner

## 2022-10-04 DIAGNOSIS — E1165 Type 2 diabetes mellitus with hyperglycemia: Secondary | ICD-10-CM

## 2022-10-12 ENCOUNTER — Encounter: Payer: Self-pay | Admitting: Nurse Practitioner

## 2022-10-12 ENCOUNTER — Ambulatory Visit (INDEPENDENT_AMBULATORY_CARE_PROVIDER_SITE_OTHER): Payer: 59 | Admitting: Nurse Practitioner

## 2022-10-12 VITALS — BP 138/86 | HR 90 | Temp 97.9°F | Resp 16 | Ht 69.0 in | Wt 294.0 lb

## 2022-10-12 DIAGNOSIS — E1169 Type 2 diabetes mellitus with other specified complication: Secondary | ICD-10-CM | POA: Diagnosis not present

## 2022-10-12 DIAGNOSIS — E785 Hyperlipidemia, unspecified: Secondary | ICD-10-CM | POA: Diagnosis not present

## 2022-10-12 DIAGNOSIS — Z7984 Long term (current) use of oral hypoglycemic drugs: Secondary | ICD-10-CM

## 2022-10-12 DIAGNOSIS — E559 Vitamin D deficiency, unspecified: Secondary | ICD-10-CM | POA: Diagnosis not present

## 2022-10-12 DIAGNOSIS — I10 Essential (primary) hypertension: Secondary | ICD-10-CM | POA: Diagnosis not present

## 2022-10-12 DIAGNOSIS — Z6841 Body Mass Index (BMI) 40.0 and over, adult: Secondary | ICD-10-CM

## 2022-10-12 LAB — COMPREHENSIVE METABOLIC PANEL
ALT: 14 U/L (ref 0–35)
AST: 12 U/L (ref 0–37)
Albumin: 4 g/dL (ref 3.5–5.2)
Alkaline Phosphatase: 84 U/L (ref 39–117)
BUN: 11 mg/dL (ref 6–23)
CO2: 25 mEq/L (ref 19–32)
Calcium: 10.1 mg/dL (ref 8.4–10.5)
Chloride: 106 mEq/L (ref 96–112)
Creatinine, Ser: 0.77 mg/dL (ref 0.40–1.20)
GFR: 82.56 mL/min (ref >60.00)
Glucose, Bld: 112 mg/dL — ABNORMAL HIGH (ref 70–99)
Potassium: 4 mEq/L (ref 3.5–5.1)
Sodium: 139 mEq/L (ref 135–145)
Total Bilirubin: 0.6 mg/dL (ref 0.2–1.2)
Total Protein: 7.8 g/dL (ref 6.0–8.3)

## 2022-10-12 LAB — LIPID PANEL
Cholesterol: 142 mg/dL (ref 0–200)
HDL: 38.7 mg/dL — ABNORMAL LOW (ref >39.00)
LDL Cholesterol: 90 mg/dL (ref 0–99)
NonHDL: 102.99
Total CHOL/HDL Ratio: 4
Triglycerides: 64 mg/dL (ref 0.0–149.0)
VLDL: 12.8 mg/dL (ref 0.0–40.0)

## 2022-10-12 LAB — MICROALBUMIN / CREATININE URINE RATIO
Creatinine,U: 158.5 mg/dL
Microalb Creat Ratio: 0.5 mg/g (ref 0.0–30.0)
Microalb, Ur: 0.8 mg/dL (ref 0.0–1.9)

## 2022-10-12 LAB — HEMOGLOBIN A1C: Hgb A1c MFr Bld: 6.1 % (ref 4.6–6.5)

## 2022-10-12 LAB — VITAMIN D 25 HYDROXY (VIT D DEFICIENCY, FRACTURES): VITD: 18.49 ng/mL — ABNORMAL LOW (ref 30.00–100.00)

## 2022-10-12 MED ORDER — VITAMIN D (ERGOCALCIFEROL) 1.25 MG (50000 UNIT) PO CAPS
50000.0000 [IU] | ORAL_CAPSULE | ORAL | 0 refills | Status: DC
Start: 2022-10-12 — End: 2023-04-23

## 2022-10-12 NOTE — Assessment & Plan Note (Signed)
Repeat lipid panel Encouraged to maintain daily exercise and DASH diet

## 2022-10-12 NOTE — Assessment & Plan Note (Signed)
No glucose check at home. No adverse effects with janumet Bp at goal  Advised to schedule appointment for annual DIABETES eye exam Repeat hgb A1c, bmp and lipid panel Maintain janumet dose F/up in 6months

## 2022-10-12 NOTE — Assessment & Plan Note (Signed)
Continues to struggle with implementing heart healthy diet and daily exercise. Reports improved fatigue and joint pain with vit. D replacement Exercise: walking 20-87mins 2x/week Diet: decreased daily calore intake She declined use of GLP-1 Wt Readings from Last 3 Encounters:  10/12/22 294 lb (133.4 kg)  06/20/22 296 lb 3.2 oz (134.4 kg)  04/12/22 297 lb (134.7 kg)    Encouraged to increase exercise frequency Provided printed information on balanced diet and serving size

## 2022-10-12 NOTE — Progress Notes (Signed)
Established Patient Visit  Patient: Cindy Shepherd   DOB: [birth year]   63 y.o. Female  MRN: [id number] Visit Date: 10/12/2022  Subjective:    Chief Complaint  Patient presents with   Hypertension   Diabetes   Hyperlipidemia    Fasting   Hypertension  Diabetes  Hyperlipidemia   Benign hypertension BP at goal with amlodipine BP Readings from Last 3 Encounters:  10/12/22 138/86  06/20/22 118/78  04/12/22 130/88    Maintain med dose Check CMP  Diabetes mellitus (HCC) No glucose check at home. No adverse effects with janumet Bp at goal  Advised to schedule appointment for annual DIABETES eye exam Repeat hgb A1c, bmp and lipid panel Maintain janumet dose F/up in 6months  Hyperlipidemia associated with type 2 diabetes mellitus (HCC) Repeat lipid panel Encouraged to maintain daily exercise and DASH diet  Morbid obesity (HCC) Continues to struggle with implementing heart healthy diet and daily exercise. Reports improved fatigue and joint pain with vit. D replacement Exercise: walking 20-38mins 2x/week Diet: decreased daily calore intake She declined use of GLP-1 Wt Readings from Last 3 Encounters:  10/12/22 294 lb (133.4 kg)  06/20/22 296 lb 3.2 oz (134.4 kg)  04/12/22 297 lb (134.7 kg)    Encouraged to increase exercise frequency Provided printed information on balanced diet and serving size  Wt Readings from Last 3 Encounters:  10/12/22 294 lb (133.4 kg)  06/20/22 296 lb 3.2 oz (134.4 kg)  04/12/22 297 lb (134.7 kg)    Reviewed medical, surgical, and social history today  Medications: Outpatient Medications Prior to Visit  Medication Sig   amLODipine (NORVASC) 10 MG tablet TAKE 1 TABLET BY MOUTH EVERY DAY   JANUMET 50-500 MG tablet TAKE 1 TABLET BY MOUTH DAILY AFTER BREAKFAST.   [DISCONTINUED] cyclobenzaprine (FLEXERIL) 5 MG tablet Take 1 tablet (5 mg total) by mouth at bedtime. (Patient not taking: Reported on 06/20/2022)    [DISCONTINUED] meloxicam (MOBIC) 7.5 MG tablet Take 1 tablet (7.5 mg total) by mouth daily. With food (Patient not taking: Reported on 06/20/2022)   [DISCONTINUED] phenazopyridine (PYRIDIUM) 100 MG tablet Take 1 tablet (100 mg total) by mouth 3 (three) times daily as needed for pain. (Patient not taking: Reported on 10/12/2022)   [DISCONTINUED] sulfamethoxazole-trimethoprim (BACTRIM DS) 800-160 MG tablet Take 1 tablet by mouth 2 (two) times daily. (Patient not taking: Reported on 10/12/2022)   [DISCONTINUED] Vitamin D, Ergocalciferol, (DRISDOL) 1.25 MG (50000 UNIT) CAPS capsule Take 1 capsule (50,000 Units total) by mouth every 7 (seven) days. (Patient not taking: Reported on 10/12/2022)   No facility-administered medications prior to visit.   Reviewed past medical and social history.   ROS per HPI above      Objective:  BP 138/86 (BP Location: Left Arm, Patient Position: Sitting, Cuff Size: Large)   Pulse 90   Temp 97.9 F (36.6 C) (Temporal)   Resp 16   Ht 5\' 9"  (1.753 m)   Wt 294 lb (133.4 kg)   SpO2 99%   BMI 43.42 kg/m      Physical Exam Vitals and nursing note reviewed.  Cardiovascular:     Rate and Rhythm: Normal rate and regular rhythm.     Pulses: Normal pulses.     Heart sounds: Normal heart sounds.  Pulmonary:     Effort: Pulmonary effort is normal.     Breath sounds: Normal breath sounds.  Musculoskeletal:  Right lower leg: No edema.     Left lower leg: No edema.  Neurological:     Mental Status: She is alert and oriented to person, place, and time.     No results found for any visits on 10/12/22.    Assessment & Plan:    Problem List Items Addressed This Visit       Cardiovascular and Mediastinum   Benign hypertension - Primary    BP at goal with amlodipine BP Readings from Last 3 Encounters:  10/12/22 138/86  06/20/22 118/78  04/12/22 130/88    Maintain med dose Check CMP      Relevant Orders   Comprehensive metabolic panel     Endocrine    Diabetes mellitus (HCC)    No glucose check at home. No adverse effects with janumet Bp at goal  Advised to schedule appointment for annual DIABETES eye exam Repeat hgb A1c, bmp and lipid panel Maintain janumet dose F/up in 6months      Relevant Orders   Hemoglobin A1c   Comprehensive metabolic panel   Microalbumin / creatinine urine ratio   Hyperlipidemia associated with type 2 diabetes mellitus (HCC)    Repeat lipid panel Encouraged to maintain daily exercise and DASH diet      Relevant Orders   Comprehensive metabolic panel   Lipid panel     Other   Morbid obesity (HCC)    Continues to struggle with implementing heart healthy diet and daily exercise. Reports improved fatigue and joint pain with vit. D replacement Exercise: walking 20-27mins 2x/week Diet: decreased daily calore intake She declined use of GLP-1 Wt Readings from Last 3 Encounters:  10/12/22 294 lb (133.4 kg)  06/20/22 296 lb 3.2 oz (134.4 kg)  04/12/22 297 lb (134.7 kg)    Encouraged to increase exercise frequency Provided printed information on balanced diet and serving size      Other Visit Diagnoses     Vitamin D deficiency       Relevant Orders   VITAMIN D 25 Hydroxy (Vit-D Deficiency, Fractures)      Return in about 6 months (around 04/13/2023) for CPE (fasting).     Alysia Penna, NP

## 2022-10-12 NOTE — Addendum Note (Signed)
Addended by: Alysia Penna L on: 10/12/2022 02:54 PM   Modules accepted: Orders

## 2022-10-12 NOTE — Patient Instructions (Addendum)
Go to lab Continue Heart healthy diet and daily exercise. Need 60-64oz of water, 106g of protein, and 5servings on vegetables daily Maintain current medications. Add Vit.D OVER THE COUNTER dose 2000IU daily

## 2022-10-12 NOTE — Assessment & Plan Note (Signed)
BP at goal with amlodipine BP Readings from Last 3 Encounters:  10/12/22 138/86  06/20/22 118/78  04/12/22 130/88    Maintain med dose Check CMP

## 2022-10-17 ENCOUNTER — Other Ambulatory Visit: Payer: Self-pay | Admitting: Obstetrics and Gynecology

## 2022-10-17 DIAGNOSIS — Z1231 Encounter for screening mammogram for malignant neoplasm of breast: Secondary | ICD-10-CM

## 2022-10-24 ENCOUNTER — Encounter: Payer: Self-pay | Admitting: Nurse Practitioner

## 2022-11-08 DIAGNOSIS — Z1231 Encounter for screening mammogram for malignant neoplasm of breast: Secondary | ICD-10-CM

## 2022-12-08 ENCOUNTER — Encounter: Payer: Self-pay | Admitting: Nurse Practitioner

## 2022-12-26 ENCOUNTER — Ambulatory Visit: Payer: 59

## 2023-04-23 ENCOUNTER — Encounter: Payer: Self-pay | Admitting: Family Medicine

## 2023-04-23 ENCOUNTER — Ambulatory Visit (INDEPENDENT_AMBULATORY_CARE_PROVIDER_SITE_OTHER): Payer: 59 | Admitting: Family Medicine

## 2023-04-23 VITALS — BP 130/78 | HR 64 | Temp 98.2°F | Ht 69.0 in | Wt 298.0 lb

## 2023-04-23 DIAGNOSIS — J0140 Acute pansinusitis, unspecified: Secondary | ICD-10-CM

## 2023-04-23 MED ORDER — DOXYCYCLINE HYCLATE 100 MG PO TABS: 100.0000 mg | ORAL_TABLET | Freq: Two times a day (BID) | ORAL | 0 refills | Status: AC

## 2023-04-23 MED ORDER — PREDNISONE 10 MG (21) PO TBPK: ORAL_TABLET | ORAL | 0 refills | Status: DC

## 2023-04-23 MED ORDER — FLUTICASONE PROPIONATE 50 MCG/ACT NA SUSP: 2.0000 | Freq: Every day | NASAL | 0 refills | Status: DC

## 2023-04-23 NOTE — Patient Instructions (Addendum)
Cough syrup (Delsym), Tylenol/Ibuprofen, Vicks, and a humidifier at night.  Albuterol for wheezing, tightness in the chest. Avoid decongestants as that would elevate your blood pressure.

## 2023-04-23 NOTE — Progress Notes (Signed)
Assessment & Plan:  1. Acute non-recurrent pansinusitis (Primary) Education provided on sinus infections.  Encouraged symptom management including cough syrup, Tylenol/ibuprofen, Vicks, and a humidifier at night.  Encouraged to use her albuterol inhaler for wheezing or tightness in the chest.  Recommended to avoid decongestants that would elevate her blood pressure. - doxycycline (VIBRA-TABS) 100 MG tablet; Take 1 tablet (100 mg total) by mouth 2 (two) times daily for 7 days.  Dispense: 14 tablet; Refill: 0 - fluticasone (FLONASE) 50 MCG/ACT nasal spray; Place 2 sprays into both nostrils daily.  Dispense: 16 g; Refill: 0 - predniSONE (STERAPRED UNI-PAK 21 TAB) 10 MG (21) TBPK tablet; As directed x 6 days  Dispense: 21 tablet; Refill: 0  No results found for any visits on 04/23/23.  Follow up plan: Return if symptoms worsen or fail to improve.  Deliah Boston, MSN, APRN, FNP-C  Subjective:  HPI: Cindy Shepherd is a 62 y.o. female presenting on 04/23/2023 for URI (Cough, congestion (yellow ans green) and rib area pain started Monday (1 week ) )  Patient complains of cough, head congestion, wheezing, and rib pain . She denies fever and shortness of breath. Onset of symptoms was 1 week ago, gradually worsening since that time. She is drinking moderate amounts of fluids. Evaluation to date: seen via telehealth and diagnosed with bronchitis. Treatment to date:  Z-Pak and Tessalon Perles .she has an albuterol inhaler but has not used it.  She does not smoke.    ROS: Negative unless specifically indicated above in HPI.   Relevant past medical history reviewed and updated as indicated.   Allergies and medications reviewed and updated.   Current Outpatient Medications:    albuterol (VENTOLIN HFA) 108 (90 Base) MCG/ACT inhaler, Inhale 1-2 puffs into the lungs every 4 (four) hours as needed., Disp: , Rfl:    amLODipine (NORVASC) 10 MG tablet, TAKE 1 TABLET BY MOUTH EVERY DAY, Disp: 90 tablet,  Rfl: 3   benzonatate (TESSALON) 200 MG capsule, Take 200 mg by mouth 3 (three) times daily as needed., Disp: , Rfl:    cholecalciferol (VITAMIN D3) 25 MCG (1000 UNIT) tablet, Take 2,000 Units by mouth daily., Disp: , Rfl:    JANUMET 50-500 MG tablet, TAKE 1 TABLET BY MOUTH DAILY AFTER BREAKFAST., Disp: 90 tablet, Rfl: 3  Allergies  Allergen Reactions   Penicillins     Has patient had a PCN reaction causing immediate rash, facial/tongue/throat swelling, SOB or lightheadedness with hypotension: Yes Has patient had a PCN reaction causing severe rash involving mucus membranes or skin necrosis: Yes Has patient had a PCN reaction that required hospitalization: No Has patient had a PCN reaction occurring within the last 10 years: No If all of the above answers are "NO", then may proceed with Cephalosporin use.     Objective:   BP 130/78   Pulse 64   Temp 98.2 F (36.8 C)   Ht 5\' 9"  (1.753 m)   Wt 298 lb (135.2 kg)   SpO2 97%   BMI 44.01 kg/m    Physical Exam Vitals reviewed.  Constitutional:      General: She is not in acute distress.    Appearance: Normal appearance. She is not ill-appearing, toxic-appearing or diaphoretic.  HENT:     Head: Normocephalic and atraumatic.     Right Ear: Ear canal and external ear normal. A middle ear effusion is present. There is no impacted cerumen. Tympanic membrane is not injected, scarred, perforated, erythematous, retracted or bulging.  Left Ear: Tympanic membrane, ear canal and external ear normal. There is no impacted cerumen.     Nose: Congestion present. No rhinorrhea.     Right Turbinates: Enlarged.     Left Turbinates: Enlarged.     Right Sinus: Maxillary sinus tenderness and frontal sinus tenderness present.     Left Sinus: Maxillary sinus tenderness and frontal sinus tenderness present.     Mouth/Throat:     Mouth: Mucous membranes are moist.     Pharynx: Oropharynx is clear. No oropharyngeal exudate or posterior oropharyngeal  erythema.  Eyes:     General: No scleral icterus.       Right eye: No discharge.        Left eye: No discharge.     Conjunctiva/sclera: Conjunctivae normal.  Cardiovascular:     Rate and Rhythm: Normal rate and regular rhythm.     Heart sounds: Normal heart sounds. No murmur heard.    No friction rub. No gallop.  Pulmonary:     Effort: Pulmonary effort is normal. No respiratory distress.     Breath sounds: No stridor. Examination of the right-upper field reveals rhonchi. Rhonchi present. No wheezing or rales.  Musculoskeletal:        General: Normal range of motion.     Cervical back: Normal range of motion.  Lymphadenopathy:     Cervical: No cervical adenopathy.  Skin:    General: Skin is warm and dry.     Capillary Refill: Capillary refill takes less than 2 seconds.  Neurological:     General: No focal deficit present.     Mental Status: She is alert and oriented to person, place, and time. Mental status is at baseline.  Psychiatric:        Mood and Affect: Mood normal.        Behavior: Behavior normal.        Thought Content: Thought content normal.        Judgment: Judgment normal.

## 2023-06-14 LAB — HM DIABETES EYE EXAM

## 2023-06-19 ENCOUNTER — Encounter: Payer: Self-pay | Admitting: Nurse Practitioner

## 2023-09-26 ENCOUNTER — Other Ambulatory Visit: Payer: Self-pay | Admitting: Nurse Practitioner

## 2023-09-26 DIAGNOSIS — I10 Essential (primary) hypertension: Secondary | ICD-10-CM

## 2023-09-26 NOTE — Telephone Encounter (Signed)
 Medication: Amlodipine  10 mg  Directions: Take 1 tablet by mouth daily  Last given: 09/11/22 Number refills: 3 Last o/v: 10/12/22 Follow up: 6 months- NOT scheduled  Labs: 10/12/22

## 2023-09-26 NOTE — Telephone Encounter (Signed)
 Tried calling patient on the number listed and get a busy tone. I will try calling patient again.   Appointment needed for continuation of 90 day supply with refills

## 2023-10-03 ENCOUNTER — Other Ambulatory Visit: Payer: Self-pay | Admitting: Nurse Practitioner

## 2023-10-03 ENCOUNTER — Other Ambulatory Visit: Payer: Self-pay

## 2023-10-03 ENCOUNTER — Other Ambulatory Visit: Payer: Self-pay | Admitting: Anesthesiology

## 2023-10-03 DIAGNOSIS — Z1231 Encounter for screening mammogram for malignant neoplasm of breast: Secondary | ICD-10-CM

## 2023-10-04 ENCOUNTER — Encounter: Admitting: Nurse Practitioner

## 2023-10-04 ENCOUNTER — Ambulatory Visit
Admission: RE | Admit: 2023-10-04 | Discharge: 2023-10-04 | Discharge status: Home / Self Care | Source: Ambulatory Visit

## 2023-10-04 DIAGNOSIS — Z1231 Encounter for screening mammogram for malignant neoplasm of breast: Secondary | ICD-10-CM

## 2023-10-10 ENCOUNTER — Ambulatory Visit: Payer: Self-pay | Admitting: Nurse Practitioner

## 2023-10-25 ENCOUNTER — Encounter: Admitting: Nurse Practitioner

## 2023-11-08 ENCOUNTER — Other Ambulatory Visit: Payer: Self-pay | Admitting: Nurse Practitioner

## 2023-11-08 DIAGNOSIS — I10 Essential (primary) hypertension: Secondary | ICD-10-CM

## 2023-11-16 ENCOUNTER — Other Ambulatory Visit: Payer: Self-pay | Admitting: Nurse Practitioner

## 2023-11-16 DIAGNOSIS — E1165 Type 2 diabetes mellitus with hyperglycemia: Secondary | ICD-10-CM

## 2023-12-07 ENCOUNTER — Encounter: Admitting: Nurse Practitioner

## 2023-12-12 ENCOUNTER — Other Ambulatory Visit: Payer: Self-pay | Admitting: Nurse Practitioner

## 2023-12-12 DIAGNOSIS — I10 Essential (primary) hypertension: Secondary | ICD-10-CM

## 2023-12-14 ENCOUNTER — Encounter: Admitting: Nurse Practitioner

## 2024-01-15 ENCOUNTER — Encounter: Payer: Self-pay | Admitting: Nurse Practitioner

## 2024-01-15 ENCOUNTER — Ambulatory Visit: Admitting: Nurse Practitioner

## 2024-01-15 VITALS — BP 132/76 | HR 94 | Temp 97.9°F | Ht 69.0 in | Wt 303.0 lb

## 2024-01-15 DIAGNOSIS — E559 Vitamin D deficiency, unspecified: Secondary | ICD-10-CM

## 2024-01-15 DIAGNOSIS — Z0000 Encounter for general adult medical examination without abnormal findings: Secondary | ICD-10-CM

## 2024-01-15 DIAGNOSIS — Z7984 Long term (current) use of oral hypoglycemic drugs: Secondary | ICD-10-CM

## 2024-01-15 DIAGNOSIS — Z0001 Encounter for general adult medical examination with abnormal findings: Secondary | ICD-10-CM

## 2024-01-15 DIAGNOSIS — E1169 Type 2 diabetes mellitus with other specified complication: Secondary | ICD-10-CM

## 2024-01-15 DIAGNOSIS — I10 Essential (primary) hypertension: Secondary | ICD-10-CM

## 2024-01-15 DIAGNOSIS — E785 Hyperlipidemia, unspecified: Secondary | ICD-10-CM

## 2024-01-15 LAB — LIPID PANEL
Cholesterol: 140 mg/dL (ref 0–200)
HDL: 37.2 mg/dL — ABNORMAL LOW (ref >39.00)
LDL Cholesterol: 89 mg/dL (ref 0–99)
NonHDL: 102.95
Total CHOL/HDL Ratio: 4
Triglycerides: 72 mg/dL (ref 0.0–149.0)
VLDL: 14.4 mg/dL (ref 0.0–40.0)

## 2024-01-15 LAB — HEMOGLOBIN A1C: Hgb A1c MFr Bld: 6.8 % — ABNORMAL HIGH (ref 4.6–6.5)

## 2024-01-15 LAB — VITAMIN D 25 HYDROXY (VIT D DEFICIENCY, FRACTURES): VITD: 27.17 ng/mL — ABNORMAL LOW (ref 30.00–100.00)

## 2024-01-15 MED ORDER — AMLODIPINE BESYLATE 10 MG PO TABS: 10.0000 mg | ORAL_TABLET | Freq: Every evening | ORAL | 3 refills | Status: AC

## 2024-01-15 NOTE — Assessment & Plan Note (Signed)
 BP at goal with amlodipine  BP Readings from Last 3 Encounters:  01/15/24 132/76  04/23/23 130/78  10/12/22 138/86    Maintain med dose Check CMP

## 2024-01-15 NOTE — Progress Notes (Signed)
 Complete physical exam  Patient: Cindy Shepherd   DOB: 08-29-1960   63 y.o. Female  MRN: [id number] Visit Date: 01/15/2024  Subjective:    Chief Complaint  Patient presents with   Annual Exam    FASTING  Due for Foot exam, Zoster and Pneumococcal Vaccines    Cindy Shepherd is a 63 y.o. female who presents today for a complete physical exam. She reports consuming a general diet. No consistent exercise regimen She generally feels well. She reports sleeping fairly well. She does have additional problems to discuss today.  Vision:Yes Dental:Yes STD Screen:No  BP Readings from Last 3 Encounters:  01/15/24 132/76  04/23/23 130/78  10/12/22 138/86   Wt Readings from Last 3 Encounters:  01/15/24 (!) 303 lb (137.4 kg)  04/23/23 298 lb (135.2 kg)  10/12/22 294 lb (133.4 kg)   Most recent fall risk assessment:    01/15/2024    9:20 AM  Fall Risk   Falls in the past year? 0  Injury with Fall? 0  Risk for fall due to : No Fall Risks  Follow up Falls evaluation completed     Depression screen:Yes - Depression Most recent depression screenings:    01/15/2024    9:21 AM 04/23/2023    9:51 AM  PHQ 2/9 Scores  PHQ - 2 Score 2 0  PHQ- 9 Score 11     HPI  Diabetes mellitus (HCC) No glucose check at home. No adverse effects with janumet  Bp at goal Normal foot exam UTD with Dm eye exam  Repeat hgb A1c, cmp, UACr and lipid panel Maintain janumet  dose F/up in 3months  Hyperlipidemia associated with type 2 diabetes mellitus (HCC) Repeat lipid panel Encouraged to maintain daily exercise and DASH diet  Benign hypertension BP at goal with amlodipine  BP Readings from Last 3 Encounters:  01/15/24 132/76  04/23/23 130/78  10/12/22 138/86    Maintain med dose Check CMP   Past Medical History:  Diagnosis Date   Diabetes mellitus without complication (HCC)    Hypertension    Past Surgical History:  Procedure Laterality Date   TUBAL LIGATION  1997   Social  History   Socioeconomic History   Marital status: Married    Spouse name: Not on file   Number of children: Not on file   Years of education: Not on file   Highest education level: Not on file  Occupational History   Not on file  Tobacco Use   Smoking status: Never   Smokeless tobacco: Never  Vaping Use   Vaping status: Never Used  Substance and Sexual Activity   Alcohol use: Not Currently   Drug use: Never   Sexual activity: Not Currently    Birth control/protection: None  Other Topics Concern   Not on file  Social History Narrative   Not on file   Social Drivers of Health   Financial Resource Strain: Not on file  Food Insecurity: Not on file  Transportation Needs: Not on file  Physical Activity: Not on file  Stress: Not on file  Social Connections: Not on file  Intimate Partner Violence: Not on file   Family Status  Relation Name Status   Mother  Alive   Father  Deceased   Neg Hx  (Not Specified)  No partnership data on file   Family History  Problem Relation Age of Onset   Diabetes Mother    Hypertension Mother    Colon cancer Neg Hx  Colon polyps Neg Hx    Esophageal cancer Neg Hx    Rectal cancer Neg Hx    Stomach cancer Neg Hx    Allergies  Allergen Reactions   Penicillins     Has patient had a PCN reaction causing immediate rash, facial/tongue/throat swelling, SOB or lightheadedness with hypotension: Yes Has patient had a PCN reaction causing severe rash involving mucus membranes or skin necrosis: Yes Has patient had a PCN reaction that required hospitalization: No Has patient had a PCN reaction occurring within the last 10 years: No If all of the above answers are NO, then may proceed with Cephalosporin use.     Patient Care Team: Demont Linford, Roselie Rockford, NP as PCP - General (Internal Medicine)   Medications: Outpatient Medications Prior to Visit  Medication Sig   cholecalciferol (VITAMIN D3) 25 MCG (1000 UNIT) tablet Take 2,000 Units by  mouth daily.   JANUMET  50-500 MG tablet TAKE 1 TABLET BY MOUTH DAILY AFTER BREAKFAST.   [DISCONTINUED] albuterol (VENTOLIN HFA) 108 (90 Base) MCG/ACT inhaler Inhale 1-2 puffs into the lungs every 4 (four) hours as needed. (Patient not taking: Reported on 01/15/2024)   [DISCONTINUED] amLODipine  (NORVASC ) 10 MG tablet TAKE 1 TABLET BY MOUTH EVERY DAY   [DISCONTINUED] benzonatate (TESSALON) 200 MG capsule Take 200 mg by mouth 3 (three) times daily as needed. (Patient not taking: Reported on 01/15/2024)   [DISCONTINUED] fluticasone  (FLONASE ) 50 MCG/ACT nasal spray Place 2 sprays into both nostrils daily. (Patient not taking: Reported on 01/15/2024)   [DISCONTINUED] predniSONE  (STERAPRED UNI-PAK 21 TAB) 10 MG (21) TBPK tablet As directed x 6 days (Patient not taking: Reported on 01/15/2024)   No facility-administered medications prior to visit.    Review of Systems  Constitutional:  Negative for activity change, appetite change and unexpected weight change.  Respiratory: Negative.    Cardiovascular: Negative.   Gastrointestinal: Negative.   Endocrine: Negative for cold intolerance and heat intolerance.  Genitourinary: Negative.   Musculoskeletal: Negative.   Skin: Negative.   Neurological: Negative.   Hematological: Negative.   Psychiatric/Behavioral:  Negative for behavioral problems, decreased concentration, dysphoric mood, hallucinations, self-injury, sleep disturbance and suicidal ideas. The patient is not nervous/anxious.         Objective:  BP 132/76 (BP Location: Right Arm, Patient Position: Sitting, Cuff Size: Large)   Pulse 94   Temp 97.9 F (36.6 C) (Oral)   Ht 5' 9 (1.753 m)   Wt (!) 303 lb (137.4 kg)   SpO2 97%   BMI 44.75 kg/m     Physical Exam Vitals and nursing note reviewed.  Constitutional:      General: She is not in acute distress. HENT:     Right Ear: Tympanic membrane, ear canal and external ear normal.     Left Ear: Tympanic membrane, ear canal and external  ear normal.     Nose: Nose normal.  Eyes:     Extraocular Movements: Extraocular movements intact.     Conjunctiva/sclera: Conjunctivae normal.     Pupils: Pupils are equal, round, and reactive to light.  Neck:     Thyroid : No thyroid  mass, thyromegaly or thyroid  tenderness.  Cardiovascular:     Rate and Rhythm: Normal rate and regular rhythm.     Pulses: Normal pulses.     Heart sounds: Normal heart sounds.  Pulmonary:     Effort: Pulmonary effort is normal.     Breath sounds: Normal breath sounds.  Abdominal:     General: Bowel sounds are  normal.     Palpations: Abdomen is soft.  Musculoskeletal:        General: Normal range of motion.     Cervical back: Normal range of motion and neck supple.     Right lower leg: No edema.     Left lower leg: No edema.  Lymphadenopathy:     Cervical: No cervical adenopathy.  Skin:    General: Skin is warm and dry.  Neurological:     Mental Status: She is alert and oriented to person, place, and time.     Cranial Nerves: No cranial nerve deficit.  Psychiatric:        Mood and Affect: Mood normal.        Behavior: Behavior normal.        Thought Content: Thought content normal.      No results found for any visits on 01/15/24.    Assessment & Plan:    Routine Health Maintenance and Physical Exam  Immunization History  Administered Date(s) Administered   Fluad Quad(high Dose 65+) 01/12/2021, 04/12/2022   Influenza, Quadrivalent, Recombinant, Inj, Pf 05/10/2018   Influenza,inj,Quad PF,6+ Mos 05/10/2018   Influenza-Unspecified 01/12/2021, 04/12/2022, 01/14/2023   Moderna Sars-Covid-2 Vaccination 10/19/2019, 11/16/2019    Health Maintenance  Topic Date Due   Diabetic kidney evaluation - Urine ACR  Never done   HEMOGLOBIN A1C  04/13/2023   Diabetic kidney evaluation - eGFR measurement  10/12/2023   COVID-19 Vaccine (3 - 2025-26 season) 01/31/2024 (Originally 12/31/2023)   Zoster Vaccines- Shingrix (1 of 2) 04/15/2024 (Originally  03/01/2010)   Influenza Vaccine  07/29/2024 (Originally 11/30/2023)   Pneumococcal Vaccine: 50+ Years (1 of 2 - PCV) 01/14/2025 (Originally 03/02/1979)   HIV Screening  01/14/2025 (Originally 03/02/1975)   OPHTHALMOLOGY EXAM  06/13/2024   Fecal DNA (Cologuard)  07/23/2024   FOOT EXAM  01/14/2025   Mammogram  10/03/2025   Cervical Cancer Screening (HPV/Pap Cotest)  12/15/2026   Hepatitis C Screening  Completed   Hepatitis B Vaccines 19-59 Average Risk  Aged Out   HPV VACCINES  Aged Out   Meningococcal B Vaccine  Aged Out   DTaP/Tdap/Td  Discontinued   Discussed health benefits of physical activity, and encouraged her to engage in regular exercise appropriate for her age and condition.  Problem List Items Addressed This Visit     Benign hypertension   BP at goal with amlodipine  BP Readings from Last 3 Encounters:  01/15/24 132/76  04/23/23 130/78  10/12/22 138/86    Maintain med dose Check CMP      Relevant Medications   amLODipine  (NORVASC ) 10 MG tablet   Diabetes mellitus (HCC)   No glucose check at home. No adverse effects with janumet  Bp at goal Normal foot exam UTD with Dm eye exam  Repeat hgb A1c, cmp, UACr and lipid panel Maintain janumet  dose F/up in 3months      Relevant Orders   Hemoglobin A1c   Microalbumin / creatinine urine ratio   Hyperlipidemia associated with type 2 diabetes mellitus (HCC)   Repeat lipid panel Encouraged to maintain daily exercise and DASH diet      Relevant Medications   amLODipine  (NORVASC ) 10 MG tablet   Other Relevant Orders   Lipid panel   Other Visit Diagnoses       Encounter for preventative adult health care exam with abnormal findings    -  Primary   Relevant Orders   Comprehensive metabolic panel with GFR     Vitamin D  deficiency  Relevant Orders   VITAMIN D  25 Hydroxy (Vit-D Deficiency, Fractures)      Return in about 3 months (around 04/15/2024) for HTN, DM, hyperlipidemia (fasting).     Roselie Mood,  NP

## 2024-01-15 NOTE — Assessment & Plan Note (Signed)
 No glucose check at home. No adverse effects with janumet  Bp at goal Normal foot exam UTD with Dm eye exam  Repeat hgb A1c, cmp, UACr and lipid panel Maintain janumet  dose F/up in 3months

## 2024-01-15 NOTE — Assessment & Plan Note (Signed)
Repeat lipid panel Encouraged to maintain daily exercise and DASH diet

## 2024-01-16 ENCOUNTER — Ambulatory Visit: Payer: Self-pay | Admitting: Nurse Practitioner

## 2024-01-16 DIAGNOSIS — E1165 Type 2 diabetes mellitus with hyperglycemia: Secondary | ICD-10-CM

## 2024-01-16 MED ORDER — JANUMET 50-500 MG PO TABS: 1.0000 | ORAL_TABLET | Freq: Every day | ORAL | 1 refills | Status: AC

## 2024-01-18 LAB — COMPREHENSIVE METABOLIC PANEL WITH GFR
ALT: 13 U/L (ref 0–35)
AST: 12 U/L (ref 0–37)
Albumin: 3.7 g/dL (ref 3.5–5.2)
Alkaline Phosphatase: 74 U/L (ref 39–117)
BUN: 10 mg/dL (ref 6–23)
CO2: 22 meq/L (ref 19–32)
Calcium: 10.3 mg/dL (ref 8.4–10.5)
Chloride: 110 meq/L (ref 96–112)
Creatinine, Ser: 0.75 mg/dL (ref 0.40–1.20)
GFR: 84.46 mL/min (ref >60.00)
Glucose, Bld: 122 mg/dL — ABNORMAL HIGH (ref 70–99)
Potassium: 3.7 meq/L (ref 3.5–5.1)
Sodium: 138 meq/L — AB (ref 135–145)
Total Bilirubin: 0.4 mg/dL (ref 0.2–1.2)
Total Protein: 7.5 g/dL (ref 6.0–8.3)

## 2024-05-23 ENCOUNTER — Encounter: Payer: Self-pay | Admitting: Nurse Practitioner

## 2024-05-23 ENCOUNTER — Ambulatory Visit: Admitting: Nurse Practitioner

## 2024-05-23 VITALS — BP 132/74 | HR 84 | Temp 97.5°F | Ht 69.0 in | Wt 299.8 lb

## 2024-05-23 DIAGNOSIS — E785 Hyperlipidemia, unspecified: Secondary | ICD-10-CM

## 2024-05-23 DIAGNOSIS — E559 Vitamin D deficiency, unspecified: Secondary | ICD-10-CM

## 2024-05-23 DIAGNOSIS — E1169 Type 2 diabetes mellitus with other specified complication: Secondary | ICD-10-CM

## 2024-05-23 DIAGNOSIS — I10 Essential (primary) hypertension: Secondary | ICD-10-CM

## 2024-05-23 DIAGNOSIS — Z7984 Long term (current) use of oral hypoglycemic drugs: Secondary | ICD-10-CM | POA: Diagnosis not present

## 2024-05-23 DIAGNOSIS — R519 Headache, unspecified: Secondary | ICD-10-CM

## 2024-05-23 LAB — LIPID PANEL
Cholesterol: 144 mg/dL (ref 28–200)
HDL: 36.9 mg/dL — ABNORMAL LOW
LDL Cholesterol: 91 mg/dL (ref 10–99)
NonHDL: 107.15
Total CHOL/HDL Ratio: 4
Triglycerides: 79 mg/dL (ref 10.0–149.0)
VLDL: 15.8 mg/dL (ref 0.0–40.0)

## 2024-05-23 LAB — COMPREHENSIVE METABOLIC PANEL WITH GFR
ALT: 14 U/L (ref 3–35)
AST: 15 U/L (ref 5–37)
Albumin: 3.9 g/dL (ref 3.5–5.2)
Alkaline Phosphatase: 77 U/L (ref 39–117)
BUN: 10 mg/dL (ref 6–23)
CO2: 25 meq/L (ref 19–32)
Calcium: 10.5 mg/dL (ref 8.4–10.5)
Chloride: 107 meq/L (ref 96–112)
Creatinine, Ser: 0.74 mg/dL (ref 0.40–1.20)
GFR: 85.62 mL/min
Glucose, Bld: 116 mg/dL — ABNORMAL HIGH (ref 70–99)
Potassium: 3.7 meq/L (ref 3.5–5.1)
Sodium: 139 meq/L (ref 135–145)
Total Bilirubin: 0.5 mg/dL (ref 0.2–1.2)
Total Protein: 8 g/dL (ref 6.0–8.3)

## 2024-05-23 LAB — MICROALBUMIN / CREATININE URINE RATIO
Creatinine,U: 400.8 mg/dL
Microalb Creat Ratio: 19.4 mg/g (ref 0.0–30.0)
Microalb, Ur: 7.8 mg/dL — ABNORMAL HIGH (ref 0.7–1.9)

## 2024-05-23 LAB — VITAMIN D 25 HYDROXY (VIT D DEFICIENCY, FRACTURES): VITD: 25.79 ng/mL — ABNORMAL LOW (ref 30.00–100.00)

## 2024-05-23 LAB — HEMOGLOBIN A1C: Hgb A1c MFr Bld: 6.9 % — ABNORMAL HIGH (ref 4.6–6.5)

## 2024-05-23 NOTE — Assessment & Plan Note (Signed)
 Previous LDL at 89, HDL at 37, TC at 140, Trig at 72 Repeat lipid panel Encouraged to maintain daily exercise and mediterranean diet

## 2024-05-23 NOTE — Patient Instructions (Signed)
 Go to lab Maintain Heart healthy diet and daily exercise. Maintain current medications. Add ibuprofen 400-600mg  every 8hrs as needed for headache. Take with food. Start flonase  as prescribed.  General Headache Without Cause A headache is pain or discomfort felt around the head or neck area. There are many causes and types of headaches. A few common types include: Tension headaches. Migraine headaches. Cluster headaches. Chronic daily headaches. Sometimes, the specific cause of a headache may not be found. Follow these instructions at home: Watch your condition for any changes. Let your health care provider know about them. Take these steps to help with your condition: Managing pain     Take over-the-counter and prescription medicines only as told by your health care provider. Treatment may include medicines for pain that are taken by mouth or applied to the skin. Lie down in a dark, quiet room when you have a headache. Keep lights dim if bright lights bother you or make your headaches worse. If directed, put ice on your head and neck area: Put ice in a plastic bag. Place a towel between your skin and the bag. Leave the ice on for 20 minutes, 2-3 times per day. Remove the ice if your skin turns bright red. This is very important. If you cannot feel pain, heat, or cold, you have a greater risk of damage to the area. If directed, apply heat to the affected area. Use the heat source that your health care provider recommends, such as a moist heat pack or a heating pad. Place a towel between your skin and the heat source. Leave the heat on for 20-30 minutes. Remove the heat if your skin turns bright red. This is especially important if you are unable to feel pain, heat, or cold. You have a greater risk of getting burned. Eating and drinking Eat meals on a regular schedule. If you drink alcohol: Limit how much you have to: 0-1 drink a day for women who are not pregnant. 0-2 drinks a day  for men. Know how much alcohol is in a drink. In the [place] one drink equals one 12 oz bottle of beer (355 mL), one 5 oz glass of wine (148 mL), or one 1 oz glass of hard liquor (44 mL). Stop drinking caffeine, or decrease the amount of caffeine you drink. Drink enough fluid to keep your urine pale yellow. General instructions  Keep a headache journal to help find out what may trigger your headaches. For example, write down: What you eat and drink. How much sleep you get. Any change to your diet or medicines. Try massage or other relaxation techniques. Limit stress. Sit up straight, and do not tense your muscles. Do not use any products that contain nicotine or tobacco. These products include cigarettes, chewing tobacco, and vaping devices, such as e-cigarettes. If you need help quitting, ask your health care provider. Exercise regularly as told by your health care provider. Sleep on a regular schedule. Get 7-9 hours of sleep each night, or the amount recommended by your health care provider. Keep all follow-up visits. This is important. Contact a health care provider if: Medicine does not help your symptoms. You have a headache that is different from your usual headache. You have nausea or you vomit. You have a fever. Get help right away if: Your headache: Becomes severe quickly. Gets worse after moderate to intense physical activity. You have any of these symptoms: Repeated vomiting. Pain or stiffness in your neck. Changes to your vision. Pain in  an eye or ear. Problems with speech. Muscular weakness or loss of muscle control. Loss of balance or coordination. You feel faint or pass out. You have confusion. You have a seizure. These symptoms may represent a serious problem that is an emergency. Do not wait to see if the symptoms will go away. Get medical help right away. Call your local emergency services (911 in the [place] Do not drive yourself to the hospital. Summary A  headache is pain or discomfort felt around the head or neck area. There are many causes and types of headaches. In some cases, the cause may not be found. Keep a headache journal to help find out what may trigger your headaches. Watch your condition for any changes. Let your health care provider know about them. Contact a health care provider if you have a headache that is different from the usual headache, or if your symptoms are not helped by medicine. Get help right away if your headache becomes severe, you vomit, you have a loss of vision, you lose your balance, or you have a seizure. This information is not intended to replace advice given to you by your health care provider. Make sure you discuss any questions you have with your health care provider. Document Revised: 09/15/2020 Document Reviewed: 09/15/2020 Elsevier Patient Education  2024 [employer].

## 2024-05-23 NOTE — Assessment & Plan Note (Signed)
 BP at goal with amlodipine  BP Readings from Last 3 Encounters:  05/23/24 132/74  01/15/24 132/76  04/23/23 130/78    Maintain med dose Check CMP

## 2024-05-23 NOTE — Assessment & Plan Note (Signed)
 No glucose check at home. No adverse effects with janumet  Bp at goal LDL at goal No neuropathy, no retinopathy  Repeat hgb A1c, cmp, UACr and lipid panel Maintain janumet  dose F/up in 3-53months

## 2024-05-23 NOTE — Progress Notes (Signed)
" ° °               Established Patient Visit  Patient: Cindy Shepherd   DOB: 09-01-1960   64 y.o. Female  MRN: [id number] Visit Date: 05/23/2024  Subjective:    Chief Complaint  Patient presents with   Follow-up    3 month follow up for HTN, DM, Hyperlipidemia  Right Hip sharp pain for 1 week relief when laying down    HPI No problem-specific Assessment & Plan notes found for this encounter.   Reviewed medical, surgical, and social history today  Medications: Show/hide medication list[1] Reviewed past medical and social history.   ROS per HPI above      Objective:  BP 132/74 (BP Location: Right Arm, Patient Position: Sitting, Cuff Size: Large)   Pulse 84   Temp (!) 97.5 F (36.4 C) (Oral)   Ht 5' 9 (1.753 m)   Wt 299 lb 12.8 oz (136 kg)   SpO2 99%   BMI 44.27 kg/m      Physical Exam  No results found for any visits on 05/23/24.    Assessment & Plan:    Problem List Items Addressed This Visit   None  No follow-ups on file.     Roselie Mood, NP        [1] Outpatient Medications Prior to Visit  Medication Sig   albuterol (VENTOLIN HFA) 108 (90 Base) MCG/ACT inhaler Inhale 1-2 puffs into the lungs every 4 (four) hours as needed for wheezing or shortness of breath.   azithromycin (ZITHROMAX) 250 MG tablet Take 250 mg by mouth daily.   benzonatate (TESSALON) 100 MG capsule Take 200 mg by mouth 3 (three) times daily.   cetirizine (ZYRTEC) 10 MG tablet Take 10 mg by mouth daily.   fluticasone  (FLONASE ) 50 MCG/ACT nasal spray Place 2 sprays into both nostrils daily.   amLODipine  (NORVASC ) 10 MG tablet Take 1 tablet (10 mg total) by mouth every evening.   cholecalciferol (VITAMIN D3) 25 MCG (1000 UNIT) tablet Take 2,000 Units by mouth daily.   sitaGLIPtin-metformin  (JANUMET ) 50-500 MG tablet Take 1 tablet by mouth daily after breakfast.   No facility-administered medications prior to visit.  "

## 2024-05-23 NOTE — Progress Notes (Signed)
 "               Established Patient Visit  Patient: Cindy Shepherd   DOB: [birth year]   64 y.o. Female  MRN: [id number] Visit Date: 05/23/2024  Subjective:    Chief Complaint  Patient presents with   Follow-up    3 month follow up for HTN, DM, Hyperlipidemia  Right head sharp pain for 1 week relief when laying down    Sinus Problem This is a new problem. The current episode started 1 to 4 weeks ago. The problem is unchanged. There has been no fever. The fever has been present for Less than 1 day. Associated symptoms include congestion, headaches and sinus pressure. Pertinent negatives include no chills, coughing, diaphoresis, ear pain, hoarse voice, neck pain, shortness of breath, sneezing, sore throat or swollen glands. Past treatments include antibiotics (azithromycin). The treatment provided mild relief.   Benign hypertension BP at goal with amlodipine  BP Readings from Last 3 Encounters:  05/23/24 132/74  01/15/24 132/76  04/23/23 130/78    Maintain med dose Check CMP  Hyperlipidemia associated with type 2 diabetes mellitus (HCC) Previous LDL at 89, HDL at 37, TC at 140, Trig at 72 Repeat lipid panel Encouraged to maintain daily exercise and mediterranean diet  Diabetes mellitus (HCC) No glucose check at home. No adverse effects with janumet  Bp at goal LDL at goal No neuropathy, no retinopathy  Repeat hgb A1c, cmp, UACr and lipid panel Maintain janumet  dose F/up in 3-82months  Reviewed medical, surgical, and social history today  Medications: Show/hide medication list[1] Reviewed past medical and social history.   ROS per HPI above      Objective:  BP 132/74 (BP Location: Right Arm, Patient Position: Sitting, Cuff Size: Large)   Pulse 84   Temp (!) 97.5 F (36.4 C) (Oral)   Ht 5' 9 (1.753 m)   Wt 299 lb 12.8 oz (136 kg)   SpO2 99%   BMI 44.27 kg/m      Physical Exam Vitals and nursing note reviewed.  Constitutional:      Appearance: She is obese.   Cardiovascular:     Rate and Rhythm: Normal rate and regular rhythm.     Pulses: Normal pulses.     Heart sounds: Normal heart sounds.  Pulmonary:     Effort: Pulmonary effort is normal.     Breath sounds: Normal breath sounds.  Neurological:     Mental Status: She is alert and oriented to person, place, and time.     Cranial Nerves: No cranial nerve deficit.  Psychiatric:        Mood and Affect: Mood normal.        Behavior: Behavior normal.        Thought Content: Thought content normal.     No results found for any visits on 05/23/24.    Assessment & Plan:    Problem List Items Addressed This Visit     Benign hypertension - Primary   BP at goal with amlodipine  BP Readings from Last 3 Encounters:  05/23/24 132/74  01/15/24 132/76  04/23/23 130/78    Maintain med dose Check CMP      Relevant Orders   Comprehensive metabolic panel with GFR   Referral to Nutrition and Diabetes Services   Diabetes mellitus (HCC)   No glucose check at home. No adverse effects with janumet  Bp at goal LDL at goal No neuropathy, no retinopathy  Repeat hgb A1c, cmp, UACr and lipid panel  Maintain janumet  dose F/up in 3-53months      Relevant Orders   Hemoglobin A1c   Comprehensive metabolic panel with GFR   Referral to Nutrition and Diabetes Services   Microalbumin / creatinine urine ratio   Hyperlipidemia associated with type 2 diabetes mellitus (HCC)   Previous LDL at 89, HDL at 37, TC at 140, Trig at 72 Repeat lipid panel Encouraged to maintain daily exercise and mediterranean diet      Relevant Orders   Comprehensive metabolic panel with GFR   Lipid panel   Referral to Nutrition and Diabetes Services   Sinus headache   Other Visit Diagnoses       Vitamin D  deficiency       Relevant Orders   VITAMIN D  25 Hydroxy (Vit-D Deficiency, Fractures)     Advised to also take ibuprofen 400-600mg  every 8hrs as needed for headache. Take with food. Start flonase  as  prescribed.  Return in about 3 months (around 08/21/2024) for HTN, DM, hyperlipidemia (fasting).     Roselie Mood, NP       [1]  Outpatient Medications Prior to Visit  Medication Sig   albuterol (VENTOLIN HFA) 108 (90 Base) MCG/ACT inhaler Inhale 1-2 puffs into the lungs every 4 (four) hours as needed for wheezing or shortness of breath.   azithromycin (ZITHROMAX) 250 MG tablet Take 250 mg by mouth daily.   benzonatate (TESSALON) 100 MG capsule Take 200 mg by mouth 3 (three) times daily.   cetirizine (ZYRTEC) 10 MG tablet Take 10 mg by mouth daily.   fluticasone  (FLONASE ) 50 MCG/ACT nasal spray Place 2 sprays into both nostrils daily.   amLODipine  (NORVASC ) 10 MG tablet Take 1 tablet (10 mg total) by mouth every evening.   cholecalciferol (VITAMIN D3) 25 MCG (1000 UNIT) tablet Take 2,000 Units by mouth daily.   sitaGLIPtin-metformin  (JANUMET ) 50-500 MG tablet Take 1 tablet by mouth daily after breakfast.   No facility-administered medications prior to visit.   "

## 2024-05-26 ENCOUNTER — Ambulatory Visit: Payer: Self-pay | Admitting: Nurse Practitioner

## 2024-05-26 DIAGNOSIS — E559 Vitamin D deficiency, unspecified: Secondary | ICD-10-CM

## 2024-05-26 DIAGNOSIS — E1169 Type 2 diabetes mellitus with other specified complication: Secondary | ICD-10-CM

## 2024-05-26 MED ORDER — VITAMIN D 25 MCG (1000 UNIT) PO TABS: 5000.0000 [IU] | ORAL_TABLET | Freq: Every day | ORAL | Status: AC

## 2024-05-26 MED ORDER — ATORVASTATIN CALCIUM 20 MG PO TABS: 20.0000 mg | ORAL_TABLET | Freq: Every evening | ORAL | 3 refills | Status: AC

## 2024-05-27 ENCOUNTER — Telehealth: Payer: Self-pay

## 2024-05-27 NOTE — Telephone Encounter (Signed)
 Copied from CRM #[id number]. Topic: Appointments - Transfer of Care >> May 27, 2024  3:34 PM Berwyn MATSU wrote: Pt is requesting to transfer FROM: Roselie Bishop Mood, NP Pt is requesting to transfer TO: Jeoffrey Barrio NP Reason for requested transfer: too far this clinic closer  It is the responsibility of the team the patient would like to transfer to (Dr. Marybelle Barrio) to reach out to the patient if for any reason this transfer is not acceptable. >> May 27, 2024  3:56 PM Nurse Alexandro Bruin C wrote: Pt is already scheduled by agent for a transfer of care with NP Jeoffrey Barrio.

## 2024-05-27 NOTE — Telephone Encounter (Unsigned)
 Copied from CRM #[id number]. Topic: Appointments - Transfer of Care >> May 27, 2024  3:34 PM Berwyn MATSU wrote: Pt is requesting to transfer FROM: Roselie Bishop Mood, NP Pt is requesting to transfer TO: Jeoffrey Barrio NP Reason for requested transfer: too far this clinic closer  It is the responsibility of the team the patient would like to transfer to (Dr. Marybelle Barrio) to reach out to the patient if for any reason this transfer is not acceptable. >> May 27, 2024  3:56 PM Nurse Alexandro Bruin C wrote: Pt is already scheduled by agent for a transfer of care with NP Jeoffrey Barrio.

## 2024-08-22 ENCOUNTER — Ambulatory Visit: Admitting: Nurse Practitioner

## 2024-09-23 ENCOUNTER — Encounter: Admitting: Family Medicine
